# Patient Record
Sex: Female | Born: 1960 | Race: Black or African American | Hispanic: No | Marital: Single | Smoking: Never smoker
Health system: Southern US, Community
[De-identification: ages and names within clinical notes are randomized; demographics above are authoritative.]

## PROBLEM LIST (undated history)

## (undated) DIAGNOSIS — J45909 Unspecified asthma, uncomplicated: Secondary | ICD-10-CM

## (undated) DIAGNOSIS — E119 Type 2 diabetes mellitus without complications: Secondary | ICD-10-CM

## (undated) DIAGNOSIS — I10 Essential (primary) hypertension: Secondary | ICD-10-CM

## (undated) HISTORY — PX: ANKLE ARTHROPLASTY: SUR68

## (undated) HISTORY — PX: KNEE ARTHROSCOPY: SUR90

---

## 2011-04-20 ENCOUNTER — Emergency Department (HOSPITAL_COMMUNITY)
Admission: EM | Admit: 2011-04-20 | Discharge: 2011-04-20 | Disposition: A | Discharge status: Home / Self Care | Payer: Medicare Other | Attending: Emergency Medicine | Admitting: Emergency Medicine

## 2011-04-20 DIAGNOSIS — J45909 Unspecified asthma, uncomplicated: Secondary | ICD-10-CM | POA: Insufficient documentation

## 2011-04-20 DIAGNOSIS — R0682 Tachypnea, not elsewhere classified: Secondary | ICD-10-CM | POA: Insufficient documentation

## 2011-09-10 ENCOUNTER — Emergency Department (HOSPITAL_COMMUNITY): Payer: Medicare Other

## 2011-09-10 ENCOUNTER — Emergency Department (HOSPITAL_COMMUNITY)
Admission: EM | Admit: 2011-09-10 | Discharge: 2011-09-10 | Disposition: A | Discharge status: Home / Self Care | Payer: Medicare Other | Attending: Emergency Medicine | Admitting: Emergency Medicine

## 2011-09-10 DIAGNOSIS — R0602 Shortness of breath: Secondary | ICD-10-CM | POA: Insufficient documentation

## 2011-09-10 DIAGNOSIS — J45909 Unspecified asthma, uncomplicated: Secondary | ICD-10-CM | POA: Insufficient documentation

## 2012-09-03 ENCOUNTER — Emergency Department (HOSPITAL_COMMUNITY): Payer: Medicare Other

## 2012-09-03 ENCOUNTER — Encounter (HOSPITAL_COMMUNITY): Payer: Self-pay | Admitting: Emergency Medicine

## 2012-09-03 ENCOUNTER — Emergency Department (HOSPITAL_COMMUNITY)
Admission: EM | Admit: 2012-09-03 | Discharge: 2012-09-03 | Disposition: A | Discharge status: Home / Self Care | Payer: Medicare Other | Attending: Emergency Medicine | Admitting: Emergency Medicine

## 2012-09-03 DIAGNOSIS — J45909 Unspecified asthma, uncomplicated: Secondary | ICD-10-CM | POA: Insufficient documentation

## 2012-09-03 DIAGNOSIS — J40 Bronchitis, not specified as acute or chronic: Secondary | ICD-10-CM | POA: Insufficient documentation

## 2012-09-03 DIAGNOSIS — J069 Acute upper respiratory infection, unspecified: Secondary | ICD-10-CM | POA: Insufficient documentation

## 2012-09-03 DIAGNOSIS — Z79899 Other long term (current) drug therapy: Secondary | ICD-10-CM | POA: Insufficient documentation

## 2012-09-03 HISTORY — DX: Unspecified asthma, uncomplicated: J45.909

## 2012-09-03 MED ORDER — PREDNISONE 20 MG PO TABS: 60.0000 mg | ORAL_TABLET | Freq: Every day | ORAL | Status: AC

## 2012-09-03 MED ORDER — ALBUTEROL SULFATE (5 MG/ML) 0.5% IN NEBU
5.0000 mg | INHALATION_SOLUTION | Freq: Once | RESPIRATORY_TRACT | Status: AC
  Administered 2012-09-03: 5 mg via RESPIRATORY_TRACT
  Filled 2012-09-03: qty 1

## 2012-09-03 MED ORDER — ALBUTEROL SULFATE (5 MG/ML) 0.5% IN NEBU
INHALATION_SOLUTION | RESPIRATORY_TRACT | Status: AC
  Administered 2012-09-03: 14:00:00
  Filled 2012-09-03: qty 1

## 2012-09-03 MED ORDER — PREDNISONE 20 MG PO TABS
60.0000 mg | ORAL_TABLET | ORAL | Status: AC
  Administered 2012-09-03: 60 mg via ORAL
  Filled 2012-09-03: qty 3

## 2012-09-03 MED ORDER — AZITHROMYCIN 250 MG PO TABS: ORAL_TABLET | ORAL | Status: AC

## 2012-09-03 MED ORDER — ALBUTEROL SULFATE (5 MG/ML) 0.5% IN NEBU
10.0000 mg | INHALATION_SOLUTION | Freq: Once | RESPIRATORY_TRACT | Status: AC
  Administered 2012-09-03: 10 mg via RESPIRATORY_TRACT
  Filled 2012-09-03: qty 1

## 2012-09-03 MED ORDER — AZITHROMYCIN 250 MG PO TABS
500.0000 mg | ORAL_TABLET | Freq: Once | ORAL | Status: AC
  Administered 2012-09-03: 500 mg via ORAL
  Filled 2012-09-03: qty 2

## 2012-09-03 MED ORDER — ALBUTEROL SULFATE HFA 108 (90 BASE) MCG/ACT IN AERS
2.0000 | INHALATION_SPRAY | Freq: Four times a day (QID) | RESPIRATORY_TRACT | Status: DC
  Administered 2012-09-03: 2 via RESPIRATORY_TRACT
  Filled 2012-09-03: qty 6.7

## 2012-09-03 NOTE — ED Notes (Signed)
Patient's sats at 89%, place on 2L [place]-MD aware

## 2012-09-03 NOTE — ED Provider Notes (Signed)
History     CSN: [id number]  Arrival date & time 09/03/12  0909   First MD Initiated Contact with Patient 09/03/12 [phone]      Chief Complaint  Patient presents with  . Shortness of Breath  . URI     HPI  The patient presents with concerns of ongoing cough, congestion.  She states that initially she had congestion in her sinuses, but over the past days she developed chest congestion.  Throughout this time.  She has developed an increasing cough.  The cough is nonproductive.  She notes minimal relief with albuterol nebulizer.  No clear exacerbating factors.  No particular chest pain, or any other pain.  No fever, no chills. No nausea, no vomiting.  Past Medical History  Diagnosis Date  . Asthma     Past Surgical History  Procedure Date  . Knee arthroscopy Bilateral  . Ankle arthroplasty Left    No family history on file.  History  Substance Use Topics  . Smoking status: Never Smoker   . Smokeless tobacco: Not on file  . Alcohol Use: No    OB History    Grav Para Term Preterm Abortions TAB SAB Ect Mult Living                  Review of Systems  Constitutional:       Per HPI, otherwise negative  HENT:       Per HPI, otherwise negative  Eyes: Negative.   Respiratory:       Per HPI, otherwise negative  Cardiovascular:       Per HPI, otherwise negative  Gastrointestinal: Negative for nausea and vomiting.  Genitourinary: Negative.   Musculoskeletal:       Per HPI, otherwise negative  Skin: Negative.   Neurological: Negative for syncope.    Allergies  Penicillins  Home Medications   Current Outpatient Rx  Name Route Sig Dispense Refill  . ALBUTEROL SULFATE HFA 108 (90 BASE) MCG/ACT IN AERS Inhalation Inhale 2 puffs into the lungs every 6 (six) hours as needed. For shortness of breath.    . GUAIFENESIN 100 MG/5ML PO SOLN Oral Take 200 mg by mouth every 4 (four) hours as needed. For cough.    . IBUPROFEN 200 MG PO TABS Oral Take 400 mg by mouth every 8  (eight) hours as needed. For pain.    Marland Kitchen LORATADINE-PSEUDOEPHEDRINE ER 10-240 MG PO TB24 Oral Take 1 tablet by mouth daily as needed. For allergies.      BP 126/110  Pulse 108  Temp 97.7 F (36.5 C)  Resp 19  SpO2 96%  LMP 08/20/2012  Physical Exam  Nursing note and vitals reviewed. Constitutional: She is oriented to person, place, and time. She appears well-developed and well-nourished. No distress.  HENT:  Head: Normocephalic and atraumatic.  Eyes: Conjunctivae normal and EOM are normal.  Cardiovascular: Regular rhythm.  Tachycardia present.   Pulmonary/Chest: No stridor. Tachypnea noted. No respiratory distress. She has wheezes.  Abdominal: She exhibits no distension.  Musculoskeletal: She exhibits no edema.  Neurological: She is alert and oriented to person, place, and time. No cranial nerve deficit.  Skin: Skin is warm and dry.  Psychiatric: She has a normal mood and affect.    ED Course  Procedures (including critical care time)  Labs Reviewed - No data to display No results found.   No diagnosis found.  O2: 88%ra, abnormal > corrects to 99% w [place], abnormal   10:54 AM Patient states  that she feels better.  Wheezing continues.  Neb ordered  1:47 PM Patient feels better.  On exam she sounds better.  The patient was ambulatory without a significant decrease in her saturation. MDM  This pleasant female presents with new cough, congestion, dyspnea.  On exam she improved substantially with multiple albuterol treatments, steroids, antibiotics.  Given this improvement, the normal vital signs after interventions, the patient was discharged with medications for this episode of dyspnea.  Gerhard Munch, MD 09/03/12 [phone]

## 2012-09-03 NOTE — ED Notes (Signed)
[id number]<JY> Expected date:<BR> Expected time: 8:59 AM<BR> Means of arrival:Ambulance<BR> Comments:<BR> 51yoF-cold, wheezing

## 2012-09-03 NOTE — ED Notes (Signed)
Pt 95% at rest, 96% ambulating RA

## 2012-09-03 NOTE — ED Notes (Signed)
Pt here via [facility]  from home. Pt has hx of asthma and c/o a head cold for a week that has gone to her chest. Pt states cough non-productive. Pt denies N/V. Pt has alb neb x1 from EMS. Pt in NAD and A&Ox4.

## 2012-09-03 NOTE — ED Notes (Signed)
Pt will be discharged after respiratory completes inhaler education

## 2012-09-20 ENCOUNTER — Emergency Department (HOSPITAL_COMMUNITY)
Admission: EM | Admit: 2012-09-20 | Discharge: 2012-09-20 | Disposition: A | Discharge status: Home / Self Care | Payer: Medicare Other | Attending: Emergency Medicine | Admitting: Emergency Medicine

## 2012-09-20 ENCOUNTER — Encounter (HOSPITAL_COMMUNITY): Payer: Self-pay | Admitting: Emergency Medicine

## 2012-09-20 ENCOUNTER — Emergency Department (HOSPITAL_COMMUNITY): Payer: Medicare Other

## 2012-09-20 DIAGNOSIS — J45901 Unspecified asthma with (acute) exacerbation: Secondary | ICD-10-CM | POA: Insufficient documentation

## 2012-09-20 DIAGNOSIS — Z79899 Other long term (current) drug therapy: Secondary | ICD-10-CM | POA: Insufficient documentation

## 2012-09-20 DIAGNOSIS — R059 Cough, unspecified: Secondary | ICD-10-CM | POA: Insufficient documentation

## 2012-09-20 DIAGNOSIS — R05 Cough: Secondary | ICD-10-CM | POA: Insufficient documentation

## 2012-09-20 DIAGNOSIS — J3489 Other specified disorders of nose and nasal sinuses: Secondary | ICD-10-CM | POA: Insufficient documentation

## 2012-09-20 DIAGNOSIS — R062 Wheezing: Secondary | ICD-10-CM | POA: Insufficient documentation

## 2012-09-20 DIAGNOSIS — E669 Obesity, unspecified: Secondary | ICD-10-CM | POA: Insufficient documentation

## 2012-09-20 MED ORDER — PREDNISONE 20 MG PO TABS
60.0000 mg | ORAL_TABLET | Freq: Once | ORAL | Status: AC
  Administered 2012-09-20: 60 mg via ORAL
  Filled 2012-09-20: qty 3

## 2012-09-20 MED ORDER — ALBUTEROL SULFATE HFA 108 (90 BASE) MCG/ACT IN AERS
2.0000 | INHALATION_SPRAY | RESPIRATORY_TRACT | Status: DC | PRN
  Administered 2012-09-20: 2 via RESPIRATORY_TRACT
  Filled 2012-09-20 (×2): qty 6.7

## 2012-09-20 MED ORDER — PREDNISONE 20 MG PO TABS: 40.0000 mg | ORAL_TABLET | Freq: Every day | ORAL | Status: DC

## 2012-09-20 MED ORDER — IPRATROPIUM BROMIDE 0.02 % IN SOLN
0.5000 mg | Freq: Once | RESPIRATORY_TRACT | Status: AC
  Administered 2012-09-20: 0.5 mg via RESPIRATORY_TRACT
  Filled 2012-09-20: qty 2.5

## 2012-09-20 MED ORDER — ALBUTEROL SULFATE (5 MG/ML) 0.5% IN NEBU
5.0000 mg | INHALATION_SOLUTION | Freq: Once | RESPIRATORY_TRACT | Status: AC
  Administered 2012-09-20: 5 mg via RESPIRATORY_TRACT
  Filled 2012-09-20: qty 1

## 2012-09-20 NOTE — ED Provider Notes (Addendum)
History     CSN: [id number]  Arrival date & time 09/20/12  1445   First MD Initiated Contact with Patient 09/20/12 1452      Chief Complaint  Patient presents with  . Shortness of Breath    (Consider location/radiation/quality/duration/timing/severity/associated sxs/prior treatment) Patient is a 51 y.o. female presenting with shortness of breath. The history is provided by the patient.  Shortness of Breath  The current episode started 2 days ago. The onset was gradual. The problem occurs frequently. The problem has been gradually worsening. The problem is moderate. The symptoms are relieved by beta-agonist inhalers. The symptoms are aggravated by activity and allergens. Associated symptoms include rhinorrhea, cough, shortness of breath and wheezing. Pertinent negatives include no chest pain and no fever. The cough is productive. She has had intermittent steroid use. She has had no prior ICU admissions. Her past medical history is significant for asthma. There were no sick contacts. Recently, medical care has been given at this facility.    Past Medical History  Diagnosis Date  . Asthma     Past Surgical History  Procedure Date  . Knee arthroscopy Bilateral  . Ankle arthroplasty Left    No family history on file.  History  Substance Use Topics  . Smoking status: Never Smoker   . Smokeless tobacco: Not on file  . Alcohol Use: No    OB History    Grav Para Term Preterm Abortions TAB SAB Ect Mult Living                  Review of Systems  Constitutional: Negative for fever.  HENT: Positive for rhinorrhea.   Respiratory: Positive for cough, shortness of breath and wheezing.   Cardiovascular: Negative for chest pain.  All other systems reviewed and are negative.    Allergies  Penicillins  Home Medications   Current Outpatient Rx  Name  Route  Sig  Dispense  Refill  . ALBUTEROL SULFATE HFA 108 (90 BASE) MCG/ACT IN AERS   Inhalation   Inhale 2 puffs into the  lungs every 6 (six) hours as needed. For shortness of breath.         . IBUPROFEN 200 MG PO TABS   Oral   Take 400 mg by mouth every 8 (eight) hours as needed. For pain.         Marland Kitchen LORATADINE-PSEUDOEPHEDRINE ER 10-240 MG PO TB24   Oral   Take 1 tablet by mouth daily as needed. For allergies.           BP 151/86  Pulse 103  Temp 98 F (36.7 C) (Oral)  Resp 16  SpO2 92%  LMP 08/20/2012  Physical Exam  Nursing note and vitals reviewed. Constitutional: She is oriented to person, place, and time. She appears well-developed and well-nourished. No distress.       Obese  HENT:  Head: Normocephalic and atraumatic.  Mouth/Throat: Oropharynx is clear and moist.  Eyes: Conjunctivae normal and EOM are normal. Pupils are equal, round, and reactive to light.  Neck: Normal range of motion. Neck supple.  Cardiovascular: Normal rate, regular rhythm and intact distal pulses.   No murmur heard. Pulmonary/Chest: Effort normal and breath sounds normal. No respiratory distress. She has no wheezes. She has no rales.  Abdominal: Soft. She exhibits no distension. There is no tenderness. There is no rebound and no guarding.  Musculoskeletal: Normal range of motion. She exhibits no edema and no tenderness.  Neurological: She is alert and oriented to  person, place, and time.  Skin: Skin is warm and dry. Rash noted. No erythema.       Eczema present over the face  Psychiatric: She has a normal mood and affect. Her behavior is normal.    ED Course  Procedures (including critical care time)  Labs Reviewed - No data to display Dg Chest 2 View  09/20/2012  *RADIOLOGY REPORT*  Clinical Data: Shortness of breath and cough  CHEST - 2 VIEW  Comparison: 09/03/2012  Findings: The heart and pulmonary vascularity are within normal limits.  The lungs are free of acute infiltrate or sizable effusion.  Degenerative changes of the thoracic spine are again noted.  IMPRESSION: No acute abnormality seen.    Original Report Authenticated By: Alcide Clever, M.D.      1. Asthma attack       MDM   Pt with typical asthma exacerbation  Symptoms. States she was seen about 2 weeks ago and given prednisone and nebs and was doing well until 2-3 days ago. She was able to manage at home with albuterol but states today was much worse. Patient was given a neb prior to my evaluation and when I evaluated the patient she was no longer wheezing.   productive cough but otherwise no other complaints.  No Wheezing on exam.  will give steroids, CXR pending.  4:49 PM CXR wnl. On re-eval pt has mild wheezing throughout and was given 1 additional neb and d/ced home.       Gwyneth Sprout, MD 09/20/12 1650  Gwyneth Sprout, MD 09/20/12 1654

## 2012-09-20 NOTE — ED Notes (Signed)
Per EMS: SOB flaring up the past couple of days, pt states inhaler is not working

## 2012-09-20 NOTE — ED Notes (Signed)
[id number]<AV> Expected date:<BR> Expected time:<BR> Means of arrival:<BR> Comments:<BR> Bronchitis

## 2013-01-09 ENCOUNTER — Emergency Department (HOSPITAL_COMMUNITY)
Admission: EM | Admit: 2013-01-09 | Discharge: 2013-01-09 | Disposition: A | Discharge status: Home / Self Care | Payer: Medicare Other | Attending: Emergency Medicine | Admitting: Emergency Medicine

## 2013-01-09 ENCOUNTER — Encounter (HOSPITAL_COMMUNITY): Payer: Self-pay | Admitting: Emergency Medicine

## 2013-01-09 ENCOUNTER — Emergency Department (HOSPITAL_COMMUNITY): Payer: Medicare Other

## 2013-01-09 DIAGNOSIS — J45901 Unspecified asthma with (acute) exacerbation: Secondary | ICD-10-CM | POA: Insufficient documentation

## 2013-01-09 DIAGNOSIS — Z79899 Other long term (current) drug therapy: Secondary | ICD-10-CM | POA: Insufficient documentation

## 2013-01-09 DIAGNOSIS — J3489 Other specified disorders of nose and nasal sinuses: Secondary | ICD-10-CM | POA: Insufficient documentation

## 2013-01-09 DIAGNOSIS — R0789 Other chest pain: Secondary | ICD-10-CM | POA: Insufficient documentation

## 2013-01-09 MED ORDER — ALBUTEROL SULFATE (5 MG/ML) 0.5% IN NEBU
2.5000 mg | INHALATION_SOLUTION | Freq: Once | RESPIRATORY_TRACT | Status: AC
  Administered 2013-01-09: 2.5 mg via RESPIRATORY_TRACT
  Filled 2013-01-09: qty 0.5

## 2013-01-09 MED ORDER — ALBUTEROL SULFATE HFA 108 (90 BASE) MCG/ACT IN AERS
2.0000 | INHALATION_SPRAY | RESPIRATORY_TRACT | Status: DC | PRN
  Administered 2013-01-09: 2 via RESPIRATORY_TRACT
  Filled 2013-01-09: qty 6.7

## 2013-01-09 MED ORDER — ALBUTEROL SULFATE (5 MG/ML) 0.5% IN NEBU
5.0000 mg | INHALATION_SOLUTION | Freq: Once | RESPIRATORY_TRACT | Status: AC
  Administered 2013-01-09: 5 mg via RESPIRATORY_TRACT
  Filled 2013-01-09: qty 1

## 2013-01-09 MED ORDER — IPRATROPIUM BROMIDE 0.02 % IN SOLN
0.5000 mg | Freq: Once | RESPIRATORY_TRACT | Status: AC
  Administered 2013-01-09: 0.5 mg via RESPIRATORY_TRACT
  Filled 2013-01-09: qty 2.5

## 2013-01-09 NOTE — ED Notes (Signed)
MD at bedside. 

## 2013-01-09 NOTE — ED Provider Notes (Signed)
History     CSN: [id number]  Arrival date & time 01/09/13  0131   First MD Initiated Contact with Patient 01/09/13 0139      Chief Complaint  Patient presents with  . Shortness of Breath    (Consider location/radiation/quality/duration/timing/severity/associated sxs/prior treatment) HPI  Patient has history of reactive airway disease. She relates she started running out of her inhaler about 3 weeks ago and her PCP would not prescribe another one without an office visit. She states when she ran out she used a friend's inhaler that was almost empty and it ran out also on the morning of every 28th. She reports since then she's been having wheezing off and on. She has a dry cough without fever. She has chronic rhinorrhea which is not changed. She denies sore throat, vomiting or diarrhea. She states she does feel like her chest is tight. She states that her asthma gets worse depending on the weather.  PCP Dr. Mirna Mires, she has an appointment to see him tomorrow  Past Medical History  Diagnosis Date  . Asthma      Past Surgical History  Procedure Laterality Date  . Knee arthroscopy  Bilateral  . Ankle arthroplasty  Left    No family history on file.  History  Substance Use Topics  . Smoking status: Never Smoker   . Smokeless tobacco: Not on file  . Alcohol Use: No   on disability for bilateral quads injuries in the past  OB History   Grav Para Term Preterm Abortions TAB SAB Ect Mult Living                  Review of Systems  All other systems reviewed and are negative.    Allergies  Penicillins  Home Medications   Current Outpatient Rx  Name  Route  Sig  Dispense  Refill  . albuterol (PROVENTIL HFA;VENTOLIN HFA) 108 (90 BASE) MCG/ACT inhaler   Inhalation   Inhale 2 puffs into the lungs every 6 (six) hours as needed. For shortness of breath.         Marland Kitchen ibuprofen (ADVIL,MOTRIN) 200 MG tablet   Oral   Take 400 mg by mouth every 8 (eight) hours as needed.  For pain.         Marland Kitchen loratadine-pseudoephedrine (CLARITIN-D 24-HOUR) 10-240 MG per 24 hr tablet   Oral   Take 1 tablet by mouth daily as needed. For allergies.           BP 130/66  Pulse 94  Temp(Src) 97.4 F (36.3 C) (Oral)  Resp 18  SpO2 94%  LMP 12/26/2012  Vital signs normal    Physical Exam  Nursing note and vitals reviewed. Constitutional: She is oriented to person, place, and time. She appears well-developed and well-nourished.  Non-toxic appearance. She does not appear ill. No distress.  Examined after first nebulizer tx  HENT:  Head: Normocephalic and atraumatic.  Right Ear: External ear normal.  Left Ear: External ear normal.  Nose: Nose normal. No mucosal edema or rhinorrhea.  Mouth/Throat: Oropharynx is clear and moist and mucous membranes are normal. No dental abscesses or edematous.  Eyes: Conjunctivae and EOM are normal. Pupils are equal, round, and reactive to light.  Neck: Normal range of motion and full passive range of motion without pain. Neck supple.  Cardiovascular: Normal rate, regular rhythm and normal heart sounds.  Exam reveals no gallop and no friction rub.   No murmur heard. Pulmonary/Chest: Effort normal. No respiratory distress.  She has wheezes. She has no rhonchi. She has no rales. She exhibits no tenderness and no crepitus.  Faint end expiratory  Abdominal: Soft. Normal appearance and bowel sounds are normal. She exhibits no distension. There is no tenderness. There is no rebound and no guarding.  Musculoskeletal: Normal range of motion. She exhibits no edema and no tenderness.  Moves all extremities well.   Neurological: She is alert and oriented to person, place, and time. She has normal strength. No cranial nerve deficit.  Skin: Skin is warm, dry and intact. No rash noted. No erythema. No pallor.  Eczema on forehead  Psychiatric: She has a normal mood and affect. Her speech is normal and behavior is normal. Her mood appears not anxious.     ED Course  Procedures (including critical care time)  Medications    albuterol (PROVENTIL) (5 MG/ML) 0.5% nebulizer solution 5 mg (5 mg Nebulization Given 01/09/13 0159)  albuterol (PROVENTIL) (5 MG/ML) 0.5% nebulizer solution 2.5 mg (2.5 mg Nebulization Given 01/09/13 0421)  ipratropium (ATROVENT) nebulizer solution 0.5 mg (0.5 mg Nebulization Given 01/09/13 0421)   Pt examined after her first nebulizer, has rare end expir wheeze, will repeat and patient should be able to be discharged.    Dg Chest 2 View  01/09/2013  *RADIOLOGY REPORT*  Clinical Data: 52 year old female with chest tightness shortness of breath and wheezing.  CHEST - 2 VIEW  Comparison: 09/20/2012 and earlier.  Findings: Stable lung volumes.  Stable cardiac size at the upper limits of normal. Other mediastinal contours are within normal limits.  Visualized tracheal air column is within normal limits. No pneumothorax, pulmonary edema, pleural effusion or acute pulmonary opacity. No acute osseous abnormality identified.  IMPRESSION: No acute cardiopulmonary abnormality.   Original Report Authenticated By: Erskine Speed, M.D.      1. Asthma exacerbation     Discharge medications albuterol (PROVENTIL HFA;VENTOLIN HFA) 108 (90 BASE) MCG/ACT inhaler 2 puff (not administered)    Plan discharge  Devoria Albe, MD, FACEP   MDM          Ward Givens, MD 01/09/13 [phone]

## 2013-01-09 NOTE — ED Notes (Signed)
Pt c/o SOB x few days. Hx of asthma. Pt has not had inhaler x 3 weeks, pt using friend's inhaler. Last inhaler use was Friday night. Lungs clear. VS 132 palpated, HR 100, RR 24, Pulse 99% RA at 0108. AAOx4. Ambulates with walker. Pt nervous since all neighbors moved and wanted to seek help incase something happened.

## 2013-01-09 NOTE — ED Notes (Signed)
[id number]<AV> Expected date:<BR> Expected time:<BR> Means of arrival:<BR> Comments:<BR> EMS/SOB-hx asthma-out of inhalers

## 2013-01-09 NOTE — ED Notes (Signed)
Patient transported to X-ray 

## 2015-06-24 ENCOUNTER — Emergency Department (HOSPITAL_COMMUNITY)
Admission: EM | Admit: 2015-06-24 | Discharge: 2015-06-25 | Disposition: A | Discharge status: Home / Self Care | Payer: Medicare Other | Attending: Emergency Medicine | Admitting: Emergency Medicine

## 2015-06-24 ENCOUNTER — Emergency Department (HOSPITAL_COMMUNITY): Payer: Medicare Other

## 2015-06-24 ENCOUNTER — Encounter (HOSPITAL_COMMUNITY): Payer: Self-pay | Admitting: Emergency Medicine

## 2015-06-24 DIAGNOSIS — J45909 Unspecified asthma, uncomplicated: Secondary | ICD-10-CM | POA: Insufficient documentation

## 2015-06-24 DIAGNOSIS — R42 Dizziness and giddiness: Secondary | ICD-10-CM

## 2015-06-24 DIAGNOSIS — R002 Palpitations: Secondary | ICD-10-CM | POA: Diagnosis not present

## 2015-06-24 DIAGNOSIS — Z79899 Other long term (current) drug therapy: Secondary | ICD-10-CM | POA: Diagnosis not present

## 2015-06-24 DIAGNOSIS — E119 Type 2 diabetes mellitus without complications: Secondary | ICD-10-CM | POA: Diagnosis not present

## 2015-06-24 DIAGNOSIS — Z880 Allergy status to penicillin: Secondary | ICD-10-CM | POA: Diagnosis not present

## 2015-06-24 HISTORY — DX: Type 2 diabetes mellitus without complications: E11.9

## 2015-06-24 LAB — I-STAT TROPONIN, ED: TROPONIN I, POC: 0 ng/mL (ref 0.00–0.08)

## 2015-06-24 LAB — BASIC METABOLIC PANEL
ANION GAP: 11 (ref 5–15)
BUN: 13 mg/dL (ref 6–20)
CO2: 25 mmol/L (ref 22–32)
Calcium: 8.7 mg/dL — ABNORMAL LOW (ref 8.9–10.3)
Chloride: 102 mmol/L (ref 101–111)
Creatinine, Ser: 0.77 mg/dL (ref 0.44–1.00)
GFR calc non Af Amer: >60 mL/min (ref >60)
GLUCOSE: 255 mg/dL — AB (ref 65–99)
POTASSIUM: 4.1 mmol/L (ref 3.5–5.1)
SODIUM: 138 mmol/L (ref 135–145)

## 2015-06-24 LAB — CBC
HEMATOCRIT: 48.4 % — AB (ref 36.0–46.0)
Hemoglobin: 15.7 g/dL — ABNORMAL HIGH (ref 12.0–15.0)
MCH: 28.6 pg (ref 26.0–34.0)
MCHC: 32.4 g/dL (ref 30.0–36.0)
MCV: 88.2 fL (ref 78.0–100.0)
PLATELETS: 346 10*3/uL (ref 150–400)
RBC: 5.49 MIL/uL — ABNORMAL HIGH (ref 3.87–5.11)
RDW: 15.5 % (ref 11.5–15.5)
WBC: 9 10*3/uL (ref 4.0–10.5)

## 2015-06-24 MED ORDER — SODIUM CHLORIDE 0.9 % IV BOLUS (SEPSIS)
2000.0000 mL | Freq: Once | INTRAVENOUS | Status: AC
  Administered 2015-06-24: 2000 mL via INTRAVENOUS

## 2015-06-24 NOTE — ED Provider Notes (Signed)
CSN: [id number]     Arrival date & time 06/24/15  2113 History   First MD Initiated Contact with Patient 06/24/15 2232     Chief Complaint  Patient presents with  . Dizziness  . Palpitations     (Consider location/radiation/quality/duration/timing/severity/associated sxs/prior Treatment) HPI Ann Holt is a 54 y.o. female with history of asthma and diabetes comes in for evaluation of dizziness. Patient states she was taking a nap, woke up at 7:00, got up out of bed at which point she started to feel slightly dizzy. She reports sitting back down and the symptoms resolving. She reports trying to get up out of bed again and then began to feel slightly dizzy again. She reports walking to the kitchen, bent over to pick up a dish and when she stood back up" the room started spinning and I could feel my heart beating". At no point did she have any headache, chest pain, shortness of breath, numbness or weakness, nausea or vomiting. She reports feeling much better since arrival in the ED. She denies any discomfort now. Nonsmoker.  Past Medical History  Diagnosis Date  . Asthma   . Diabetes mellitus without complication    Past Surgical History  Procedure Laterality Date  . Knee arthroscopy  Bilateral  . Ankle arthroplasty  Left   No family history on file. Social History  Substance Use Topics  . Smoking status: Never Smoker   . Smokeless tobacco: None  . Alcohol Use: No   OB History    No data available     Review of Systems A 10 point review of systems was completed and was negative except for pertinent positives and negatives as mentioned in the history of present illness     Allergies  Penicillins  Home Medications   Prior to Admission medications   Medication Sig Start Date End Date Taking? Authorizing Provider  albuterol (PROVENTIL HFA;VENTOLIN HFA) 108 (90 BASE) MCG/ACT inhaler Inhale 2 puffs into the lungs every 6 (six) hours as needed. For shortness of breath.   Yes  Historical Provider, MD  glipiZIDE (GLUCOTROL XL) 10 MG 24 hr tablet Take 10 mg by mouth daily with breakfast.  07/28/14 07/28/15 Yes Historical Provider, MD  ibuprofen (ADVIL,MOTRIN) 200 MG tablet Take 400 mg by mouth every 8 (eight) hours as needed for moderate pain. For pain.   Yes Historical Provider, MD  lisinopril (PRINIVIL,ZESTRIL) 10 MG tablet Take 10 mg by mouth daily.  08/25/14  Yes Historical Provider, MD  loratadine-pseudoephedrine (CLARITIN-D 24-HOUR) 10-240 MG per 24 hr tablet Take 1 tablet by mouth daily as needed for allergies. For allergies.   Yes Historical Provider, MD  metFORMIN (GLUCOPHAGE) 1000 MG tablet Take 1,000 mg by mouth 2 (two) times daily with a meal.  08/25/14 08/25/15 Yes Historical Provider, MD  montelukast (SINGULAIR) 10 MG tablet Take 10 mg by mouth at bedtime.   Yes Historical Provider, MD   BP 144/94 mmHg  Pulse 87  Temp(Src) 97.7 F (36.5 C) (Oral)  Resp 16  SpO2 95%  LMP 12/26/2012 Physical Exam  Constitutional: She is oriented to person, place, and time. She appears well-developed and well-nourished.  Morbidly obese  HENT:  Head: Normocephalic and atraumatic.  Mouth/Throat: Oropharynx is clear and moist.  Eyes: Conjunctivae are normal. Pupils are equal, round, and reactive to light. Right eye exhibits no discharge. Left eye exhibits no discharge. No scleral icterus.  Neck: Neck supple.  Cardiovascular: Normal rate, regular rhythm and normal heart sounds.   Pulmonary/Chest:  Effort normal and breath sounds normal. No respiratory distress. She has no wheezes. She has no rales.  Abdominal: Soft. There is no tenderness.  Musculoskeletal: She exhibits no tenderness.  Neurological: She is alert and oriented to person, place, and time.  Cranial Nerves II-XII grossly intact. Moves all extremities without ataxia. At baseline per family the room.  Skin: Skin is warm and dry. No rash noted.  Psychiatric: She has a normal mood and affect.  Nursing note and  vitals reviewed.   ED Course  Procedures (including critical care time) Labs Review Labs Reviewed  BASIC METABOLIC PANEL - Abnormal; Notable for the following:    Glucose, Bld 255 (*)    Calcium 8.7 (*)    All other components within normal limits  CBC - Abnormal; Notable for the following:    RBC 5.49 (*)    Hemoglobin 15.7 (*)    HCT 48.4 (*)    All other components within normal limits  Rosezena Sensor, ED    Imaging Review Dg Chest 2 View  06/24/2015   CLINICAL DATA:  Awoke with dizziness and diaphoresis. Chest palpitations. Baseline shortness of breath, history of asthma and diabetes.  EXAM: CHEST  2 VIEW  COMPARISON:  Chest radiograph January 09, 2013  FINDINGS: The heart size and mediastinal contours are within normal limits. Both lungs are clear. The visualized skeletal structures are nonacute; moderate degenerative change of the thoracic spine.  IMPRESSION: No acute cardiopulmonary process.   Electronically Signed   By: Awilda Metro M.D.   On: 06/24/2015 22:09   I, Sandee Bernath, Earle Gell, personally reviewed and evaluated these images and lab results as part of my medical decision-making.   EKG Interpretation   Date/Time:  Sunday June 24 2015 21:26:36 EDT Ventricular Rate:  106 PR Interval:  136 QRS Duration: 80 QT Interval:  347 QTC Calculation: 461 R Axis:   61 Text Interpretation:  Sinus tachycardia Confirmed by Adriana Simas  MD, BRIAN  [phone]) on 06/24/2015 11:10:33 PM Also confirmed by Adriana Simas  MD, BRIAN ([id number])   on 06/24/2015 11:10:52 PM     Meds given in ED:  Medications  sodium chloride 0.9 % bolus 2,000 mL (0 mLs Intravenous Stopped 06/25/15 0031)    New Prescriptions   No medications on file   Filed Vitals:   06/24/15 2319 06/25/15 0028 06/25/15 0030 06/25/15 0031  BP: 112/51 144/94  144/94  Pulse: 89 89 87   Temp:  97.7 F (36.5 C)    TempSrc:  Oral    Resp: 18 16    SpO2: 95% 95% 95%     MDM  Vitals stable - WNL -afebrile Pt resting comfortably  in ED. PE--normal heart and lung exam. Physical exam is not concerning Labwork-labs are noncontributory. Imaging-chest x-ray shows no acute cardiopulmonary pathology.  DDX--patient tachycardia resolved with 2 L of normal saline IV fluids. Suspect symptoms especially yesterday due to orthostasis versus dehydration. Low suspicion for any acute cardiopulmonary pathology or central lesion. Patient denies any discomfort whatsoever now in ED. Daughter reports patient is at baseline. Both are comfortable with discharge.  I discussed all relevant lab findings and imaging results with pt and they verbalized understanding. Discussed f/u with PCP within 48 hrs and return precautions, pt very amenable to plan. Prior to patient discharge, I discussed and reviewed this case with Dr. Adriana Simas  Final diagnoses:  Dizziness        Joycie Peek, PA-C 06/25/15 0981  Donnetta Hutching, MD 06/27/15 [phone]

## 2015-06-24 NOTE — ED Notes (Signed)
Pt reports she woke up from a nap with dizziness and states "the room is spinning" and "my heart is letting itself be known". Pt denies cardiac HX.  Denies CP but reports her usual shortness of breath. Denies changes in vision,headaches, or weakness.

## 2015-06-25 NOTE — Discharge Instructions (Signed)
You were evaluated in the ED today for dizziness. There does not appear to be an emergent cause for your symptoms at this time. Your exam, labs, EKG and chest x-ray are all reassuring. Please follow-up with your doctors as needed for further evaluation and management of her symptoms. Return to ED for new or worsening symptoms.  Dizziness Dizziness is a common problem. It is a feeling of unsteadiness or light-headedness. You may feel like you are about to faint. Dizziness can lead to injury if you stumble or fall. A person of any age group can suffer from dizziness, but dizziness is more common in older adults. CAUSES  Dizziness can be caused by many different things, including:  Middle ear problems.  Standing for too long.  Infections.  An allergic reaction.  Aging.  An emotional response to something, such as the sight of blood.  Side effects of medicines.  Tiredness.  Problems with circulation or blood pressure.  Excessive use of alcohol or medicines, or illegal drug use.  Breathing too fast (hyperventilation).  An irregular heart rhythm (arrhythmia).  A low red blood cell count (anemia).  Pregnancy.  Vomiting, diarrhea, fever, or other illnesses that cause body fluid loss (dehydration).  Diseases or conditions such as Parkinson's disease, high blood pressure (hypertension), diabetes, and thyroid problems.  Exposure to extreme heat. DIAGNOSIS  Your health care provider will ask about your symptoms, perform a physical exam, and perform an electrocardiogram (ECG) to record the electrical activity of your heart. Your health care provider may also perform other heart or blood tests to determine the cause of your dizziness. These may include:  Transthoracic echocardiogram (TTE). During echocardiography, sound waves are used to evaluate how blood flows through your heart.  Transesophageal echocardiogram (TEE).  Cardiac monitoring. This allows your health care provider to  monitor your heart rate and rhythm in real time.  Holter monitor. This is a portable device that records your heartbeat and can help diagnose heart arrhythmias. It allows your health care provider to track your heart activity for several days if needed.  Stress tests by exercise or by giving medicine that makes the heart beat faster. TREATMENT  Treatment of dizziness depends on the cause of your symptoms and can vary greatly. HOME CARE INSTRUCTIONS   Drink enough fluids to keep your urine clear or pale yellow. This is especially important in very hot weather. In older adults, it is also important in cold weather.  Take your medicine exactly as directed if your dizziness is caused by medicines. When taking blood pressure medicines, it is especially important to get up slowly.  Rise slowly from chairs and steady yourself until you feel okay.  In the morning, first sit up on the side of the bed. When you feel okay, stand slowly while holding onto something until you know your balance is fine.  Move your legs often if you need to stand in one place for a long time. Tighten and relax your muscles in your legs while standing.  Have someone stay with you for 1-2 days if dizziness continues to be a problem. Do this until you feel you are well enough to stay alone. Have the person call your health care provider if he or she notices changes in you that are concerning.  Do not drive or use heavy machinery if you feel dizzy.  Do not drink alcohol. SEEK IMMEDIATE MEDICAL CARE IF:   Your dizziness or light-headedness gets worse.  You feel nauseous or vomit.  You have problems talking, walking, or using your arms, hands, or legs.  You feel weak.  You are not thinking clearly or you have trouble forming sentences. It may take a friend or family member to notice this.  You have chest pain, abdominal pain, shortness of breath, or sweating.  Your vision changes.  You notice any bleeding.  You  have side effects from medicine that seems to be getting worse rather than better. MAKE SURE YOU:   Understand these instructions.  Will watch your condition.  Will get help right away if you are not doing well or get worse. Document Released: 04/22/2001 Document Revised: 11/01/2013 Document Reviewed: 05/16/2011 [employer] [facility] [employer] Patient Information 2015 [place], [place]. This information is not intended to replace advice given to you by your health care provider. Make sure you discuss any questions you have with your health care provider.

## 2016-11-24 ENCOUNTER — Emergency Department (HOSPITAL_COMMUNITY)
Admission: EM | Admit: 2016-11-24 | Discharge: 2016-11-24 | Disposition: A | Discharge status: Home / Self Care | Payer: Medicare Other | Attending: Emergency Medicine | Admitting: Emergency Medicine

## 2016-11-24 ENCOUNTER — Encounter (HOSPITAL_COMMUNITY): Payer: Self-pay | Admitting: Emergency Medicine

## 2016-11-24 ENCOUNTER — Emergency Department (HOSPITAL_COMMUNITY): Payer: Medicare Other

## 2016-11-24 DIAGNOSIS — J45909 Unspecified asthma, uncomplicated: Secondary | ICD-10-CM | POA: Diagnosis not present

## 2016-11-24 DIAGNOSIS — E119 Type 2 diabetes mellitus without complications: Secondary | ICD-10-CM | POA: Insufficient documentation

## 2016-11-24 DIAGNOSIS — M1712 Unilateral primary osteoarthritis, left knee: Secondary | ICD-10-CM | POA: Diagnosis not present

## 2016-11-24 DIAGNOSIS — M25562 Pain in left knee: Secondary | ICD-10-CM | POA: Diagnosis present

## 2016-11-24 DIAGNOSIS — Z7984 Long term (current) use of oral hypoglycemic drugs: Secondary | ICD-10-CM | POA: Diagnosis not present

## 2016-11-24 MED ORDER — HYDROCODONE-ACETAMINOPHEN 5-325 MG PO TABS: ORAL_TABLET | ORAL | 0 refills | Status: DC

## 2016-11-24 MED ORDER — ALBUTEROL SULFATE HFA 108 (90 BASE) MCG/ACT IN AERS
2.0000 | INHALATION_SPRAY | Freq: Once | RESPIRATORY_TRACT | Status: AC
  Administered 2016-11-24: 2 via RESPIRATORY_TRACT
  Filled 2016-11-24: qty 6.7

## 2016-11-24 MED ORDER — HYDROCODONE-ACETAMINOPHEN 5-325 MG PO TABS
1.0000 | ORAL_TABLET | Freq: Once | ORAL | Status: AC
  Administered 2016-11-24: 1 via ORAL
  Filled 2016-11-24: qty 1

## 2016-11-24 NOTE — Discharge Instructions (Signed)
Take vicodin for breakthrough pain, do not drink alcohol, drive, care for children or do other critical tasks while taking vicodin. ° °Please be very careful not to fall! °The pain medication puts you at risk for falls. Please rest as much as possible and try to not stay alone.  ° °Please follow with your primary care doctor in the next 2 days for a check-up. They must obtain records for further management.  ° °Do not hesitate to return to the Emergency Department for any new, worsening or concerning symptoms.  ° ° °

## 2016-11-24 NOTE — ED Notes (Signed)
[facility] Expected date:  Expected time:  Means of arrival:  Comments: EMS knee pain after previous quad tear

## 2016-11-24 NOTE — ED Notes (Signed)
Pain med given tech to walk pt in 15 mins

## 2016-11-24 NOTE — ED Triage Notes (Signed)
Pt comes to ed via ems,c/o left knee pain ( associated from a old fall 20 yrs ago).  Pt says ibuprofen is not working and cold weather right now is making her stiff and having mobility issues. V/s on arrival 156/88, pulse 100, cbg244,  Hx of diabetes/ has not taken her medicine verbalized by ems. Medical hx of asthma. Pt comes from home [address] [place]. Alert x4

## 2016-11-24 NOTE — ED Provider Notes (Signed)
[facility]-EMERGENCY DEPT Provider Note   CSN: [id number] Arrival date & time: 11/24/16  1937  By signing my name below, I, Modena Jansky, attest that this documentation has been prepared under the direction and in the presence of non-physician practitioner, Wynetta Emery, PA. Electronically Signed: Modena Jansky, Scribe. 11/24/2016. 9:11 PM.  History   Chief Complaint Chief Complaint  Patient presents with  . Knee Pain    left knee    The history is provided by the patient. No language interpreter was used.   HPI Comments: Ann Holt is a 56 y.o. female with a PMHx of asthma and DM who presents to the Emergency Department complaining of constant moderate left knee pain that started a few days ago. She states her same pain was unresolved from a fall 20 years ago and has recently worsened with the cold weather. She has been taking 800mg  ibuprofen without any relief since pain worsened. Before worsening, pain was managed prior with ibuprofen. She describes the pain as exacerbated by movement. No associated symptoms. She has been using a cane for ambulation recently. She admits to a left quadricept surgery and plans on having a left knee replacement surgery in the future. She also wants an albuterol inhaler. She denies any other complaints.     PCP: Evlyn Courier, MD  Past Medical History:  Diagnosis Date  . Asthma   . Diabetes mellitus without complication (HCC)     There are no active problems to display for this patient.   Past Surgical History:  Procedure Laterality Date  . ANKLE ARTHROPLASTY  Left  . KNEE ARTHROSCOPY  Bilateral    OB History    No data available       Home Medications    Prior to Admission medications   Medication Sig Start Date End Date Taking? Authorizing Provider  albuterol (PROVENTIL HFA;VENTOLIN HFA) 108 (90 BASE) MCG/ACT inhaler Inhale 2 puffs into the lungs every 6 (six) hours as needed. For shortness of breath.    Historical Provider, MD    glipiZIDE (GLUCOTROL XL) 10 MG 24 hr tablet Take 10 mg by mouth daily with breakfast.  07/28/14 07/28/15  Historical Provider, MD  HYDROcodone-acetaminophen (NORCO/VICODIN) 5-325 MG tablet Take 1-2 tablets by mouth every 6 hours as needed for pain. 11/24/16   Kumar Falwell, PA-C  ibuprofen (ADVIL,MOTRIN) 200 MG tablet Take 400 mg by mouth every 8 (eight) hours as needed for moderate pain. For pain.    Historical Provider, MD  lisinopril (PRINIVIL,ZESTRIL) 10 MG tablet Take 10 mg by mouth daily.  08/25/14   Historical Provider, MD  loratadine-pseudoephedrine (CLARITIN-D 24-HOUR) 10-240 MG per 24 hr tablet Take 1 tablet by mouth daily as needed for allergies. For allergies.    Historical Provider, MD  metFORMIN (GLUCOPHAGE) 1000 MG tablet Take 1,000 mg by mouth 2 (two) times daily with a meal.  08/25/14 08/25/15  Historical Provider, MD  montelukast (SINGULAIR) 10 MG tablet Take 10 mg by mouth at bedtime.    Historical Provider, MD    Family History No family history on file.  Social History Social History  Substance Use Topics  . Smoking status: Never Smoker  . Smokeless tobacco: Not on file  . Alcohol use No     Allergies   Penicillins   Review of Systems Review of Systems A complete 10 system review of systems was obtained and all systems are negative except as noted in the HPI and PMH.   Physical Exam Updated Vital Signs BP 150/58 (  BP Location: Left Arm)   Pulse 91   Temp 97.6 F (36.4 C) (Oral)   Resp 18   LMP 12/26/2012   SpO2 94%   Physical Exam  Constitutional: She is oriented to person, place, and time. She appears well-developed and well-nourished. No distress.  Obese  HENT:  Head: Normocephalic and atraumatic.  Mouth/Throat: Oropharynx is clear and moist.  Eyes: Conjunctivae and EOM are normal. Pupils are equal, round, and reactive to light.  Neck: Normal range of motion.  Cardiovascular: Normal rate, regular rhythm and intact distal pulses.    Pulmonary/Chest: Effort normal and breath sounds normal.  Abdominal: Soft. There is no tenderness.  Musculoskeletal: Normal range of motion. She exhibits edema.  Left knee with remote well-healed surgical scar excellent range of motion, trace warmth and mild effusion.  Neurological: She is alert and oriented to person, place, and time.  Skin: She is not diaphoretic.  Psychiatric: She has a normal mood and affect.  Nursing note and vitals reviewed.    ED Treatments / Results  DIAGNOSTIC STUDIES: Oxygen Saturation is 94% on RA, normal by my interpretation.    COORDINATION OF CARE: 9:15 PM- Pt advised of plan for treatment and pt agrees.  Labs (all labs ordered are listed, but only abnormal results are displayed) Labs Reviewed - No data to display  EKG  EKG Interpretation None       Radiology Dg Knee Complete 4 Views Left  Result Date: 11/24/2016 CLINICAL DATA:  Left knee pain for 1 week, worsened today. Intermittent pain for many years. EXAM: LEFT KNEE - COMPLETE 4+ VIEW COMPARISON:  None. FINDINGS: No acute fracture or subluxation. Tricompartmental osteoarthritis with peripheral spurring. Mild medial tibiofemoral joint space narrowing. No evidence of focal lesion or bony destructive change. Quadriceps tendon enthesophyte. There is a moderate joint effusion. IMPRESSION: 1. Mild to moderate tricompartmental osteoarthritis. No acute bony abnormality. 2. Moderate joint effusion. Electronically Signed   By: Rubye Oaks M.D.   On: 11/24/2016 21:21    Procedures Procedures (including critical care time)  Medications Ordered in ED Medications  HYDROcodone-acetaminophen (NORCO/VICODIN) 5-325 MG per tablet 1 tablet (not administered)     Initial Impression / Assessment and Plan / ED Course  I have reviewed the triage vital signs and the nursing notes.  Pertinent labs & imaging results that were available during my care of the patient were reviewed by me and considered in my  medical decision making (see chart for details).  Clinical Course     Vitals:   11/24/16 1951  BP: 150/58  Pulse: 91  Resp: 18  Temp: 97.6 F (36.4 C)  TempSrc: Oral  SpO2: 94%    Medications  HYDROcodone-acetaminophen (NORCO/VICODIN) 5-325 MG per tablet 1 tablet (not administered)    Ann Holt is 56 y.o. female presenting with Dramatic exacerbation of her chronic left knee pain. She follows with Dr. Constance Goltz and a knee replacement is eventually planned. Excellent range of motion, doubt septic arthritis. Trace warmth consistent with inflammatory reaction. She didn't taking ibuprofen at home with little relief, will give a short prescription of Vicodin. Counseled her on fall precautions. X-ray with tricompartmental osteoarthritis with moderate effusion.  Evaluation does not show pathology that would require ongoing emergent intervention or inpatient treatment. Pt is hemodynamically stable and mentating appropriately. Discussed findings and plan with patient/guardian, who agrees with care plan. All questions answered. Return precautions discussed and outpatient follow up given.      Final Clinical Impressions(s) / ED Diagnoses  Final diagnoses:  Arthritis of left knee    New Prescriptions New Prescriptions   HYDROCODONE-ACETAMINOPHEN (NORCO/VICODIN) 5-325 MG TABLET    Take 1-2 tablets by mouth every 6 hours as needed for pain.   I personally performed the services described in this documentation, which was scribed in my presence. The recorded information has been reviewed and is accurate.     Wynetta Emeryicole Clovis Warwick, PA-C 11/24/16 2135    Arby BarretteMarcy Pfeiffer, MD 11/25/16 [phone]

## 2017-07-10 IMAGING — CR DG KNEE COMPLETE 4+V*L*
4 series · 4 of 4 positions shown · non-contrast
Comparison: None.

CLINICAL DATA: Left knee pain for 1 week, worsened today.
Intermittent pain for many years.

EXAM:
LEFT KNEE - COMPLETE 4+ VIEW

[x knee ap left (1 of 4)]
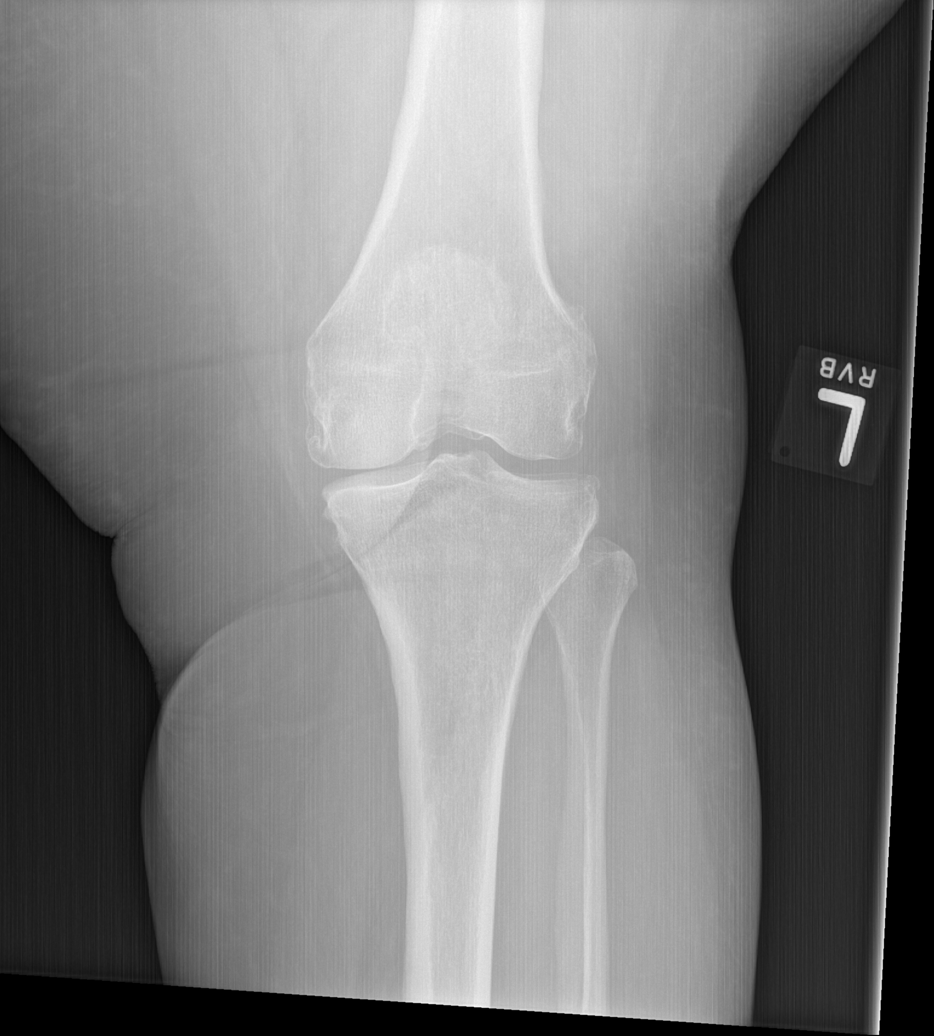

[x knee ap left (2 of 4)]
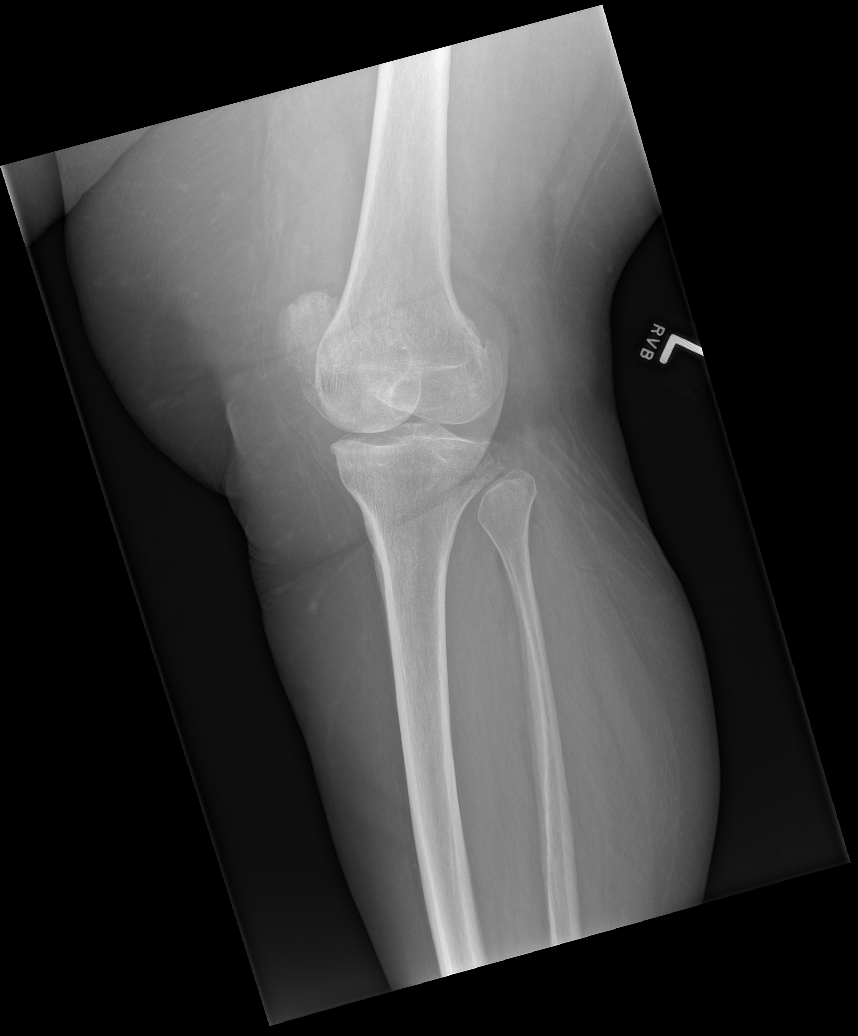

[x knee ap left (3 of 4)]
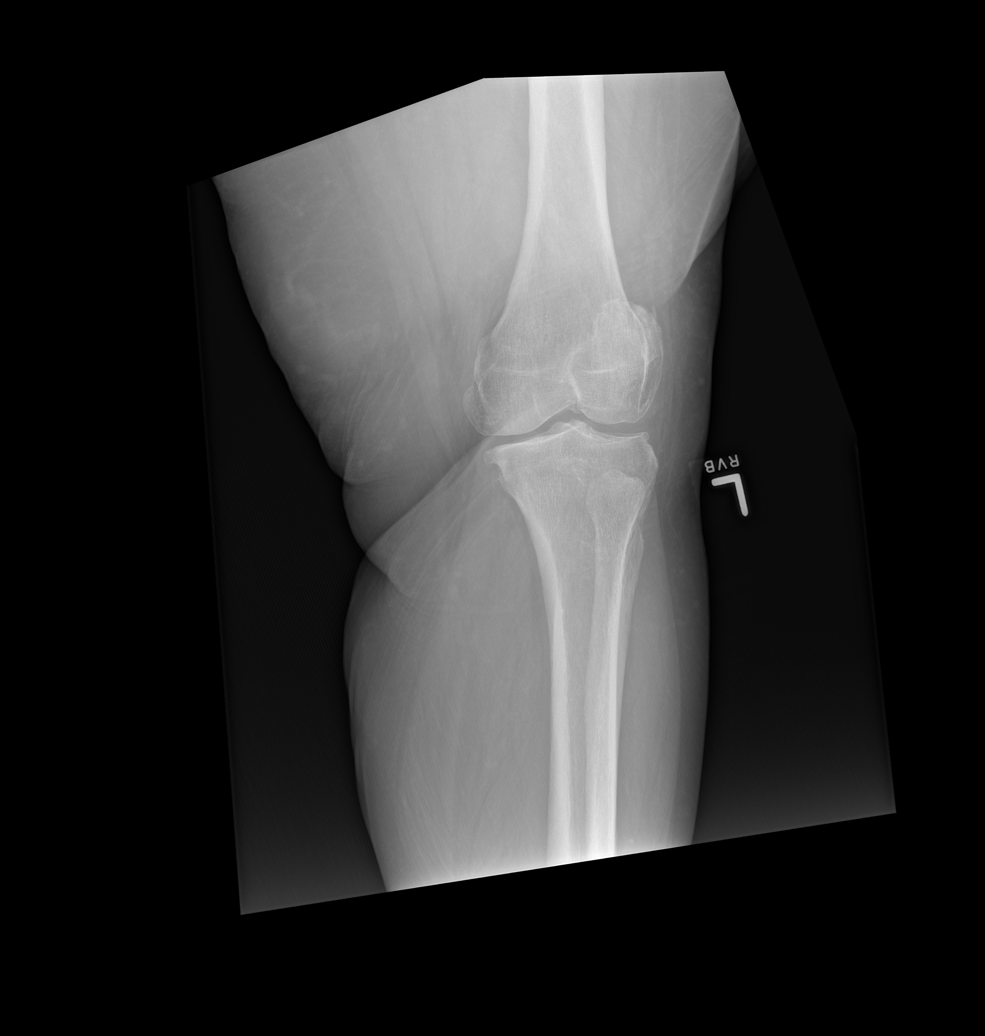

[x knee ap left (4 of 4)]
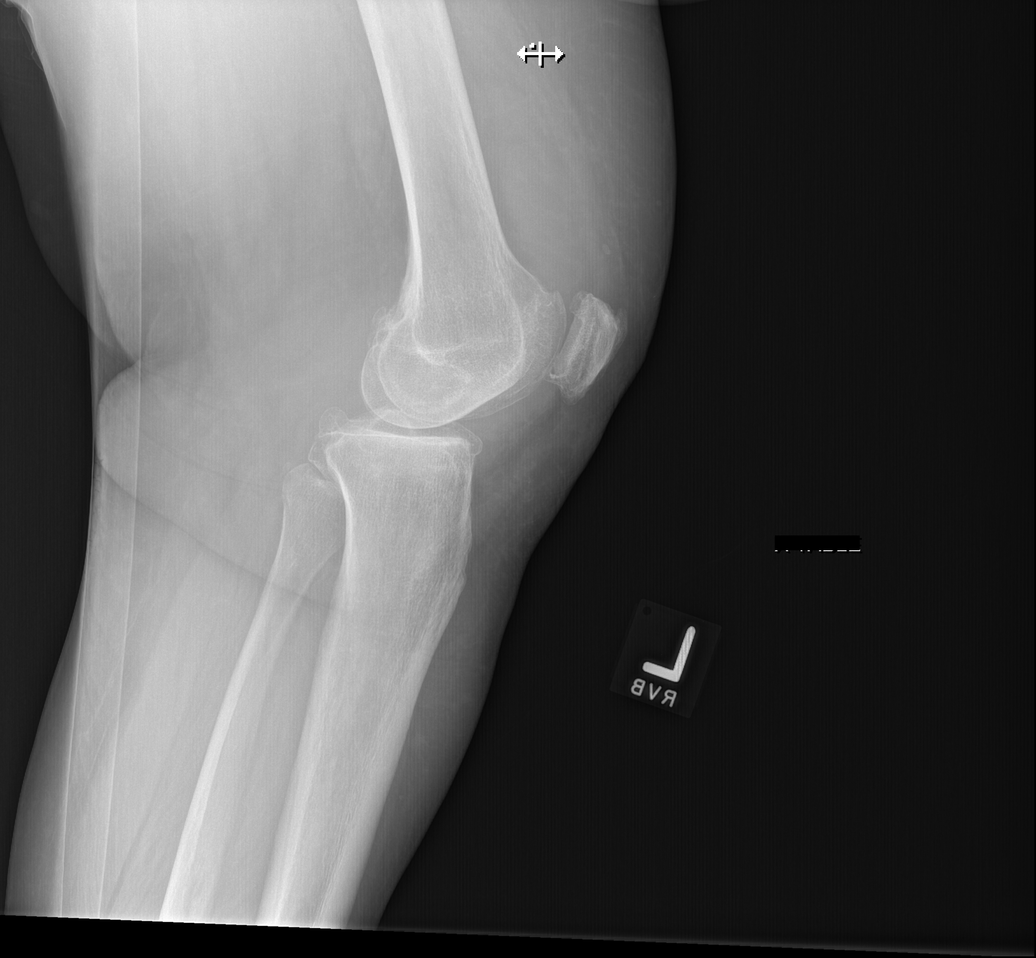

[4 of 4 positions shown; findings below may reference images not displayed]

FINDINGS: No acute fracture or subluxation. Tricompartmental osteoarthritis
with peripheral spurring. Mild medial tibiofemoral joint space
narrowing. No evidence of focal lesion or bony destructive change.
Quadriceps tendon enthesophyte. There is a moderate joint effusion.
IMPRESSION: 1. Mild to moderate tricompartmental osteoarthritis. No acute bony
abnormality.
2. Moderate joint effusion.

## 2023-04-07 LAB — EXTERNAL GENERIC LAB PROCEDURE: COLOGUARD: NEGATIVE

## 2023-04-07 LAB — COLOGUARD: COLOGUARD: NEGATIVE

## 2023-11-02 ENCOUNTER — Other Ambulatory Visit: Payer: Self-pay

## 2023-11-02 ENCOUNTER — Encounter (HOSPITAL_COMMUNITY): Payer: Self-pay

## 2023-11-02 ENCOUNTER — Emergency Department (HOSPITAL_COMMUNITY): Payer: Medicare HMO

## 2023-11-02 ENCOUNTER — Emergency Department (HOSPITAL_COMMUNITY)
Admission: EM | Admit: 2023-11-02 | Discharge: 2023-11-02 | Disposition: A | Discharge status: Home / Self Care | Payer: Medicare HMO | Attending: Emergency Medicine | Admitting: Emergency Medicine

## 2023-11-02 DIAGNOSIS — R739 Hyperglycemia, unspecified: Secondary | ICD-10-CM

## 2023-11-02 DIAGNOSIS — E1165 Type 2 diabetes mellitus with hyperglycemia: Secondary | ICD-10-CM | POA: Insufficient documentation

## 2023-11-02 DIAGNOSIS — Z79899 Other long term (current) drug therapy: Secondary | ICD-10-CM | POA: Insufficient documentation

## 2023-11-02 DIAGNOSIS — Z7984 Long term (current) use of oral hypoglycemic drugs: Secondary | ICD-10-CM | POA: Insufficient documentation

## 2023-11-02 DIAGNOSIS — G510 Bell's palsy: Secondary | ICD-10-CM | POA: Insufficient documentation

## 2023-11-02 DIAGNOSIS — I10 Essential (primary) hypertension: Secondary | ICD-10-CM | POA: Diagnosis not present

## 2023-11-02 DIAGNOSIS — R29810 Facial weakness: Secondary | ICD-10-CM | POA: Diagnosis present

## 2023-11-02 LAB — CBC WITH DIFFERENTIAL/PLATELET
Abs Immature Granulocytes: 0.03 10*3/uL (ref 0.00–0.07)
Basophils Absolute: 0 10*3/uL (ref 0.0–0.1)
Basophils Relative: 1 %
Eosinophils Absolute: 0.5 10*3/uL (ref 0.0–0.5)
Eosinophils Relative: 6 %
HCT: 42.4 % (ref 36.0–46.0)
Hemoglobin: 13.9 g/dL (ref 12.0–15.0)
Immature Granulocytes: 0 %
Lymphocytes Relative: 37 %
Lymphs Abs: 3 10*3/uL (ref 0.7–4.0)
MCH: 29.3 pg (ref 26.0–34.0)
MCHC: 32.8 g/dL (ref 30.0–36.0)
MCV: 89.3 fL (ref 80.0–100.0)
Monocytes Absolute: 0.5 10*3/uL (ref 0.1–1.0)
Monocytes Relative: 7 %
Neutro Abs: 4 10*3/uL (ref 1.7–7.7)
Neutrophils Relative %: 49 %
Platelets: 336 10*3/uL (ref 150–400)
RBC: 4.75 MIL/uL (ref 3.87–5.11)
RDW: 13.7 % (ref 11.5–15.5)
WBC: 8.1 10*3/uL (ref 4.0–10.5)
nRBC: 0 % (ref 0.0–0.2)

## 2023-11-02 LAB — BASIC METABOLIC PANEL
Anion gap: 12 (ref 5–15)
BUN: 11 mg/dL (ref 8–23)
CO2: 29 mmol/L (ref 22–32)
Calcium: 8.4 mg/dL — ABNORMAL LOW (ref 8.9–10.3)
Chloride: 97 mmol/L — ABNORMAL LOW (ref 98–111)
Creatinine, Ser: 0.73 mg/dL (ref 0.44–1.00)
GFR, Estimated: >60 mL/min (ref >60)
Glucose, Bld: 328 mg/dL — ABNORMAL HIGH (ref 70–99)
Potassium: 3 mmol/L — ABNORMAL LOW (ref 3.5–5.1)
Sodium: 138 mmol/L (ref 135–145)

## 2023-11-02 LAB — CBG MONITORING, ED: Glucose-Capillary: 287 mg/dL — ABNORMAL HIGH (ref 70–99)

## 2023-11-02 MED ORDER — VALACYCLOVIR HCL 1 G PO TABS: 1000.0000 mg | ORAL_TABLET | Freq: Three times a day (TID) | ORAL | 0 refills | Status: AC

## 2023-11-02 MED ORDER — PREDNISONE 20 MG PO TABS
60.0000 mg | ORAL_TABLET | Freq: Once | ORAL | Status: AC
  Administered 2023-11-02: 60 mg via ORAL
  Filled 2023-11-02: qty 3

## 2023-11-02 MED ORDER — POTASSIUM CHLORIDE CRYS ER 20 MEQ PO TBCR
40.0000 meq | EXTENDED_RELEASE_TABLET | Freq: Once | ORAL | Status: AC
  Administered 2023-11-02: 40 meq via ORAL
  Filled 2023-11-02: qty 2

## 2023-11-02 MED ORDER — PREDNISONE 20 MG PO TABS: 40.0000 mg | ORAL_TABLET | Freq: Every day | ORAL | 0 refills | Status: AC

## 2023-11-02 NOTE — ED Provider Triage Note (Signed)
Emergency Medicine Provider Triage Evaluation Note  Ann Holt , a 62 y.o. female  was evaluated in triage.  Pt complains of facial weakness   Review of Systems  Positive: weakness Negative: fever  Physical Exam  BP (!) 176/111 (BP Location: Right Arm)   Pulse (!) 104   Temp 98.6 F (37 C)   Resp 20   Ht 5\' 1"  (1.549 m)   Wt 95.3 kg   LMP 12/26/2012   SpO2 100%   BMI 39.68 kg/m  Gen:   Awake, no distress   Resp:  Normal effort  MSK:   Moves extremities without difficulty  Other:    Medical Decision Making  Medically screening exam initiated at 3:11 PM.  Appropriate orders placed.  Ann Holt was informed that the remainder of the evaluation will be completed by another provider, this initial triage assessment does not replace that evaluation, and the importance of remaining in the ED until their evaluation is complete.     Elson Areas, [place] 11/02/23 1514

## 2023-11-02 NOTE — Discharge Instructions (Signed)
As we discussed, you have Bell's palsy.  I recommend that he take prednisone as prescribed  I also recommend that you take Valtrex as prescribed  Your left eye may feel more dry so I recommend putting lubricant eyedrops to the left eye and put a eye shade or tape your eye at night  See your doctor for follow-up  The prednisone may get your blood sugar elevated so please check it often and give yourself insulin as prescribed  Return to ER if you have worse facial droop or trouble speaking or numbness or weakness

## 2023-11-02 NOTE — ED Provider Notes (Signed)
[place] EMERGENCY DEPARTMENT AT [facility] Provider Note   CSN: [id number] Arrival date & time: 11/02/23  1452     History  Chief Complaint  Patient presents with   Facial Droop    Ann Holt is a 62 y.o. female history of diabetes, hypertension here presenting with left facial droop.  Patient states that she went to bed around 4:30 AM.  She states that when she went to bed, she was feeling fine.  She states that her daughter woke her around 58 AM.  She went to the bathroom and noticed left facial droop.  She states that she has no trouble speaking.  She states that she also felt that her left eye was a little dry.  She has some sinus congestion for the last several days.  She was concerned that she may have Bell's palsy.  Denies any arm or leg weakness  The history is provided by the patient.       Home Medications Prior to Admission medications   Medication Sig Start Date End Date Taking? Authorizing Provider  albuterol (PROVENTIL HFA;VENTOLIN HFA) 108 (90 BASE) MCG/ACT inhaler Inhale 2 puffs into the lungs every 6 (six) hours as needed. For shortness of breath.    [provider]  glipiZIDE (GLUCOTROL XL) 10 MG 24 hr tablet Take 10 mg by mouth daily with breakfast.  07/28/14 07/28/15  [provider]  HYDROcodone-acetaminophen (NORCO/VICODIN) 5-325 MG tablet Take 1-2 tablets by mouth every 6 hours as needed for pain. 11/24/16   Pisciotta, Joni Reining, PA-C  ibuprofen (ADVIL,MOTRIN) 200 MG tablet Take 400 mg by mouth every 8 (eight) hours as needed for moderate pain. For pain.    [provider]  lisinopril (PRINIVIL,ZESTRIL) 10 MG tablet Take 10 mg by mouth daily.  08/25/14   [provider]  loratadine-pseudoephedrine (CLARITIN-D 24-HOUR) 10-240 MG per 24 hr tablet Take 1 tablet by mouth daily as needed for allergies. For allergies.    [provider]  metFORMIN (GLUCOPHAGE) 1000 MG tablet Take 1,000 mg by mouth 2 (two) times  daily with a meal.  08/25/14 08/25/15  [provider]  montelukast (SINGULAIR) 10 MG tablet Take 10 mg by mouth at bedtime.    [provider]      Allergies    Penicillins    Review of Systems   Review of Systems  Neurological:  Positive for facial asymmetry.  All other systems reviewed and are negative.   Physical Exam Updated Vital Signs BP (!) 176/111 (BP Location: Right Arm)   Pulse (!) 104   Temp 98.6 F (37 C)   Resp 20   Ht 5\' 1"  (1.549 m)   Wt 95.3 kg   LMP 12/26/2012   SpO2 100%   BMI 39.68 kg/m  Physical Exam Vitals and nursing note reviewed.  HENT:     Head: Normocephalic.     Nose: Nose normal.     Mouth/Throat:     Mouth: Mucous membranes are moist.  Eyes:     Extraocular Movements: Extraocular movements intact.     Pupils: Pupils are equal, round, and reactive to light.  Cardiovascular:     Rate and Rhythm: Normal rate and regular rhythm.     Pulses: Normal pulses.     Heart sounds: Normal heart sounds.  Pulmonary:     Effort: Pulmonary effort is normal.     Breath sounds: Normal breath sounds.  Abdominal:     General: Abdomen is flat.  Palpations: Abdomen is soft.  Musculoskeletal:        General: Normal range of motion.     Cervical back: Normal range of motion.  Skin:    General: Skin is warm.  Neurological:     Mental Status: She is alert.     Comments: Patient has obvious left facial droop.  Patient also has hard time closing her left eye.  Patient has slightly decreased sensation on left face.  Normal strength and sensation bilateral arms and legs.  Psychiatric:        Mood and Affect: Mood normal.        Behavior: Behavior normal.     ED Results / Procedures / Treatments   Labs (all labs ordered are listed, but only abnormal results are displayed) Labs Reviewed  CBG MONITORING, ED - Abnormal; Notable for the following components:      Result Value   Glucose-Capillary 287 (*)    All other components within  normal limits  CBC WITH DIFFERENTIAL/PLATELET  BASIC METABOLIC PANEL    EKG EKG Interpretation Date/Time:  Monday November 02 2023 15:00:04 EST Ventricular Rate:  105 PR Interval:  120 QRS Duration:  76 QT Interval:  368 QTC Calculation: 486 R Axis:   75  Text Interpretation: Sinus tachycardia with Premature supraventricular complexes Possible Anterior infarct , age undetermined Abnormal ECG When compared with ECG of 24-Jun-2015 21:26, PREVIOUS ECG IS PRESENT Confirmed by Richardean Canal [phone]) on 11/02/2023 3:37:35 PM  Radiology No results found.  Procedures Procedures    Medications Ordered in ED Medications  predniSONE (DELTASONE) tablet 60 mg (has no administration in time range)    ED Course/ Medical Decision Making/ A&P                                 Medical Decision Making Ann Holt is a 62 y.o. female here presenting with Bell's palsy.  Patient has obvious Bell's palsy.  Last normal was around 4:30 AM.  Patient has no other signs of stroke.  Plan to get CT head and if is negative do not need MRI or CTA.  Will get labs.  6:05 PM Reviewed patient's labs and glucose is 300.  Patient's anion gap is normal.  CT showed no acute stroke.  I think patient likely has Bell's palsy.  Will give a course of steroids and Valtrex.  I told patient that her glucose may be elevated and told her to check her sugar frequently and give herself insulin as prescribed by her doctor  Problems Addressed: Bell's palsy: acute illness or injury Hyperglycemia: acute illness or injury  Amount and/or Complexity of Data Reviewed Labs: ordered. Decision-making details documented in ED Course. Radiology: ordered and independent interpretation performed. Decision-making details documented in ED Course.  Risk Prescription drug management.    Final Clinical Impression(s) / ED Diagnoses Final diagnoses:  None    Rx / DC Orders ED Discharge Orders     None         Charlynne Pander, MD 11/02/23 1807

## 2023-11-02 NOTE — ED Triage Notes (Signed)
Pt states she woke up at 0430 this am because her left eye felt like it had something in it and then went back to sleep. Pt states she woke up at 1130 with left sided facial drooping. Pt has numbness in left side of face.

## 2024-04-14 ENCOUNTER — Emergency Department (HOSPITAL_COMMUNITY)

## 2024-04-14 ENCOUNTER — Emergency Department (HOSPITAL_COMMUNITY)
Admission: EM | Admit: 2024-04-14 | Discharge: 2024-04-14 | Disposition: A | Discharge status: Home / Self Care | Attending: Emergency Medicine | Admitting: Emergency Medicine

## 2024-04-14 ENCOUNTER — Encounter (HOSPITAL_COMMUNITY): Payer: Self-pay

## 2024-04-14 ENCOUNTER — Other Ambulatory Visit: Payer: Self-pay

## 2024-04-14 DIAGNOSIS — Z79899 Other long term (current) drug therapy: Secondary | ICD-10-CM | POA: Insufficient documentation

## 2024-04-14 DIAGNOSIS — R0600 Dyspnea, unspecified: Secondary | ICD-10-CM | POA: Diagnosis present

## 2024-04-14 DIAGNOSIS — Z7984 Long term (current) use of oral hypoglycemic drugs: Secondary | ICD-10-CM | POA: Insufficient documentation

## 2024-04-14 DIAGNOSIS — E119 Type 2 diabetes mellitus without complications: Secondary | ICD-10-CM | POA: Insufficient documentation

## 2024-04-14 DIAGNOSIS — I10 Essential (primary) hypertension: Secondary | ICD-10-CM | POA: Diagnosis not present

## 2024-04-14 DIAGNOSIS — J4520 Mild intermittent asthma, uncomplicated: Secondary | ICD-10-CM | POA: Diagnosis not present

## 2024-04-14 LAB — BASIC METABOLIC PANEL WITH GFR
Anion gap: 10 (ref 5–15)
BUN: 12 mg/dL (ref 8–23)
CO2: 27 mmol/L (ref 22–32)
Calcium: 8.8 mg/dL — ABNORMAL LOW (ref 8.9–10.3)
Chloride: 104 mmol/L (ref 98–111)
Creatinine, Ser: 0.72 mg/dL (ref 0.44–1.00)
GFR, Estimated: >60 mL/min (ref >60)
Glucose, Bld: 225 mg/dL — ABNORMAL HIGH (ref 70–99)
Potassium: 3.7 mmol/L (ref 3.5–5.1)
Sodium: 141 mmol/L (ref 135–145)

## 2024-04-14 LAB — CBC
HCT: 42.2 % (ref 36.0–46.0)
Hemoglobin: 13.5 g/dL (ref 12.0–15.0)
MCH: 29.2 pg (ref 26.0–34.0)
MCHC: 32 g/dL (ref 30.0–36.0)
MCV: 91.3 fL (ref 80.0–100.0)
Platelets: 324 10*3/uL (ref 150–400)
RBC: 4.62 MIL/uL (ref 3.87–5.11)
RDW: 13.9 % (ref 11.5–15.5)
WBC: 8 10*3/uL (ref 4.0–10.5)
nRBC: 0 % (ref 0.0–0.2)

## 2024-04-14 LAB — D-DIMER, QUANTITATIVE: D-Dimer, Quant: 0.68 ug{FEU}/mL — ABNORMAL HIGH (ref 0.00–0.50)

## 2024-04-14 MED ORDER — ALBUTEROL SULFATE HFA 108 (90 BASE) MCG/ACT IN AERS
2.0000 | INHALATION_SPRAY | RESPIRATORY_TRACT | Status: DC | PRN
  Administered 2024-04-14: 2 via RESPIRATORY_TRACT
  Filled 2024-04-14: qty 6.7

## 2024-04-14 MED ORDER — IOHEXOL 350 MG/ML SOLN
75.0000 mL | Freq: Once | INTRAVENOUS | Status: AC | PRN
Start: 2024-04-14 — End: 2024-04-14
  Administered 2024-04-14: 75 mL via INTRAVENOUS

## 2024-04-14 MED ORDER — PREDNISONE 10 MG PO TABS: 20.0000 mg | ORAL_TABLET | Freq: Every day | ORAL | 0 refills | Status: DC

## 2024-04-14 MED ORDER — METHYLPREDNISOLONE SODIUM SUCC 125 MG IJ SOLR
125.0000 mg | Freq: Once | INTRAMUSCULAR | Status: AC
  Administered 2024-04-14: 125 mg via INTRAVENOUS
  Filled 2024-04-14: qty 2

## 2024-04-14 NOTE — ED Notes (Signed)
Patient verbalizes understanding of discharge instructions. Opportunity for questioning and answers were provided. 

## 2024-04-14 NOTE — Discharge Instructions (Signed)
 Please continue to use your inhaler up to every 4 hours.  You are being started on prednisone .  Return if you are having any new or worsening symptoms.  Call your doctor for follow-up in the next 1 to 2 days.

## 2024-04-14 NOTE — ED Provider Notes (Signed)
 [place] EMERGENCY DEPARTMENT AT [facility] Provider Note   CSN: [id number] Arrival date & time: 04/14/24  4098     History  No chief complaint on file.   Ann Holt is a 63 y.o. female.  HPI 63 year old female history of asthma, diabetes, hypertension, presents today complaining of wheezing and dyspnea.  She states she is darted having some wheezing last night.  She used her albuterol  inhaler with some relief and then again having symptoms again later in the evening.  She again used the inhaler.  She felt worse this morning and called 911.  She was noted to have expiratory wheezing in her upper lobes and was given DuoNebs prior to arrival with improvement of her wheezing.  She was placed on oxygen prehospital by EMS and oxygen saturations are 96%.  She reports not being on oxygen at home.  She has not had to be hospitalized or be on steroids for her asthma for quite a while.    Home Medications Prior to Admission medications   Medication Sig Start Date End Date Taking? Authorizing Provider  predniSONE  (DELTASONE ) 10 MG tablet Take 2 tablets (20 mg total) by mouth daily. 04/14/24  Yes Auston Blush, MD  albuterol  (PROVENTIL  HFA;VENTOLIN  HFA) 108 (90 BASE) MCG/ACT inhaler Inhale 2 puffs into the lungs every 6 (six) hours as needed. For shortness of breath.    [provider]  glipiZIDE (GLUCOTROL XL) 10 MG 24 hr tablet Take 10 mg by mouth daily with breakfast.  07/28/14 07/28/15  [provider]  HYDROcodone -acetaminophen  (NORCO/VICODIN) 5-325 MG tablet Take 1-2 tablets by mouth every 6 hours as needed for pain. 11/24/16   Pisciotta, Peterson Brandt, PA-C  ibuprofen (ADVIL,MOTRIN) 200 MG tablet Take 400 mg by mouth every 8 (eight) hours as needed for moderate pain. For pain.    [provider]  lisinopril (PRINIVIL,ZESTRIL) 10 MG tablet Take 10 mg by mouth daily.  08/25/14   [provider]  loratadine-pseudoephedrine (CLARITIN-D 24-HOUR) 10-240 MG per  24 hr tablet Take 1 tablet by mouth daily as needed for allergies. For allergies.    [provider]  metFORMIN (GLUCOPHAGE) 1000 MG tablet Take 1,000 mg by mouth 2 (two) times daily with a meal.  08/25/14 08/25/15  [provider]  montelukast (SINGULAIR) 10 MG tablet Take 10 mg by mouth at bedtime.    [provider]      Allergies    Penicillins    Review of Systems   Review of Systems  Physical Exam Updated Vital Signs BP (!) 156/69   Pulse 92   Temp 98.7 F (37.1 C) (Oral)   Resp 16   LMP 12/26/2012   SpO2 100%  Physical Exam Vitals and nursing note reviewed.  Constitutional:      Appearance: Normal appearance. She is normal weight.  HENT:     Head: Normocephalic and atraumatic.     Right Ear: External ear normal.     Left Ear: External ear normal.     Nose: Nose normal.     Mouth/Throat:     Pharynx: Oropharynx is clear.  Eyes:     Extraocular Movements: Extraocular movements intact.     Pupils: Pupils are equal, round, and reactive to light.  Cardiovascular:     Rate and Rhythm: Regular rhythm. Tachycardia present.     Pulses: Normal pulses.  Pulmonary:     Breath sounds: No stridor. No wheezing, rhonchi or rales.     Comments: Tachypnea noted Abdominal:  General: Abdomen is flat. Bowel sounds are normal.     Palpations: Abdomen is soft.  Musculoskeletal:        General: No swelling or deformity. Normal range of motion.     Cervical back: Normal range of motion and neck supple.     Right lower leg: No edema.     Left lower leg: No edema.  Skin:    General: Skin is warm and dry.     Capillary Refill: Capillary refill takes less than 2 seconds.  Neurological:     General: No focal deficit present.     Mental Status: She is alert.  Psychiatric:        Mood and Affect: Mood normal.     ED Results / Procedures / Treatments   Labs (all labs ordered are listed, but only abnormal results are displayed) Labs Reviewed  BASIC  METABOLIC PANEL WITH GFR - Abnormal; Notable for the following components:      Result Value   Glucose, Bld 225 (*)    Calcium 8.8 (*)    All other components within normal limits  D-DIMER, QUANTITATIVE - Abnormal; Notable for the following components:   D-Dimer, Quant 0.68 (*)    All other components within normal limits  CBC    EKG None  Radiology CT Angio Chest PE W and/or Wo Contrast Result Date: 04/14/2024 CLINICAL DATA:  Shortness of breath. EXAM: CT ANGIOGRAPHY CHEST WITH CONTRAST TECHNIQUE: Multidetector CT imaging of the chest was performed using the standard protocol during bolus administration of intravenous contrast. Multiplanar CT image reconstructions and MIPs were obtained to evaluate the vascular anatomy. RADIATION DOSE REDUCTION: This exam was performed according to the departmental dose-optimization program which includes automated exposure control, adjustment of the mA and/or kV according to patient size and/or use of iterative reconstruction technique. CONTRAST:  75mL OMNIPAQUE IOHEXOL 350 MG/ML SOLN COMPARISON:  None Available. FINDINGS: Cardiovascular: There is mild calcification of the aortic arch, without evidence of aortic aneurysm. The segmental and subsegmental bilateral lower lobe pulmonary arteries are limited in evaluation secondary to areas of overlying artifact. No evidence of pulmonary embolism. Normal heart size. No pericardial effusion. Mediastinum/Nodes: There is mild AP window, pretracheal and bilateral hilar lymphadenopathy. Thyroid gland, trachea, and esophagus demonstrate no significant findings. Lungs/Pleura: Mild to moderate severity diffuse interstitial thickening is noted. This is most prominent within the bilateral upper lobes. Mild lingular, anteromedial right middle lobe and posterior bilateral lower lobe scarring and/or atelectasis is seen. There are small bilateral pleural effusions, right greater than left. No pneumothorax is identified. Upper Abdomen:  No acute abnormality. Musculoskeletal: Multilevel degenerative changes are seen throughout the thoracic spine. This is most prominent at the level of T5-T6. Review of the MIP images confirms the above findings. IMPRESSION: 1. No evidence of pulmonary embolism. 2. Mild to moderate severity diffuse interstitial thickening, which may represent sequelae associated with interstitial edema. 3. Small bilateral pleural effusions, right greater than left. 4. Mild lingular, anteromedial right middle lobe and posterior bilateral lower lobe scarring and/or atelectasis. 5. Mild mediastinal and bilateral hilar lymphadenopathy, likely reactive in nature. 6. Aortic atherosclerosis. Electronically Signed   By: Virgle Grime M.D.   On: 04/14/2024 13:26   DG Chest Port 1 View Result Date: 04/14/2024 CLINICAL DATA:  Dyspnea EXAM: PORTABLE CHEST 1 VIEW COMPARISON:  06/24/2015 FINDINGS: Single frontal view of the chest demonstrates an enlarged cardiac silhouette, likely exaggerated by AP lordotic positioning. No airspace disease, effusion, or pneumothorax. There are no acute bony  abnormalities. IMPRESSION: 1. No acute intrathoracic process. Electronically Signed   By: Bobbye Burrow M.D.   On: 04/14/2024 09:53    Procedures Procedures    Medications Ordered in ED Medications  albuterol  (VENTOLIN  HFA) 108 (90 Base) MCG/ACT inhaler 2 puff (has no administration in time range)  methylPREDNISolone sodium succinate (SOLU-MEDROL) 125 mg/2 mL injection 125 mg (125 mg Intravenous Given 04/14/24 0937)  iohexol (OMNIPAQUE) 350 MG/ML injection 75 mL (75 mLs Intravenous Contrast Given 04/14/24 1302)    ED Course/ Medical Decision Making/ A&P Clinical Course as of 04/14/24 1340  Thu Apr 14, 2024  1232 D-dimer slightly elevated at 0.68 will obtain CTA [DR]  1330 CTA reviewed and no evidence of pulmonary embolism, they note mild to moderate severity diffuse interstitial thickening which may represent sequela associated with  interstitial edema, small bilateral pleural effusions with right greater than left, mild lingula anteromedial right middle lobe and posterior bilateral lobe scarring [DR]    Clinical Course User Index [DR] Auston Blush, MD                                 Medical Decision Making Amount and/or Complexity of Data Reviewed Labs: ordered. Radiology: ordered.  Risk Prescription drug management.   64 year old female with history of asthma Zentz today with some chest tightness, wheezing, and dyspnea.  Wheezing was reported prehospital had essentially resolved on my initial evaluation.  Patient was given additional albuterol  due to decreased air movement. She was reevaluated and has good air movement with no wheezing noted.  She received IV Solu-Medrol here prior to this.  However, she is tachycardic on my reevaluation.  Secondary to this and unclear as to whether or not she had wheezing earlier, I ordered D-dimer.  This is slightly elevated.  Patient will require CTA due to elevated D-dimer. If CTA is negative, patient will be stable for discharge to home and will be discharged to home on prednisone  and will continue her home albuterol  CTA reviewed and no evidence of PE noted. Patient appears stable for discharge to home       Final Clinical Impression(s) / ED Diagnoses Final diagnoses:  Dyspnea, unspecified type  Intermittent asthma, unspecified asthma severity, unspecified whether complicated    Rx / DC Orders ED Discharge Orders          Ordered    predniSONE  (DELTASONE ) 10 MG tablet  Daily        04/14/24 1304              Auston Blush, MD 04/14/24 1340

## 2024-04-14 NOTE — ED Triage Notes (Signed)
 Pt bib ems from home; hx asthma; sob since last night; used albuterol  inhaler with some relief; hr 100, BP 140 palp; expiratory wheezing upper lobes, duoneb pta, wheezing improved; cbg 206; 96% RA, rr 20

## 2024-05-24 NOTE — Progress Notes (Signed)
 This patient's chart has been reviewed by a [facility] Specialist.   Attempted to contact patient in order to discuss appointments and health maintenance screenings due for the upcoming year.   UTR (No voicemail / No phone number / Invalid phone number). and [employer] message with health maintenance recommendations and [facility] Specialist's contact information..  Additional Comments: rang 5x, no vm

## 2024-06-16 ENCOUNTER — Encounter (HOSPITAL_COMMUNITY): Payer: Self-pay | Admitting: Emergency Medicine

## 2024-06-16 ENCOUNTER — Emergency Department (HOSPITAL_COMMUNITY)

## 2024-06-16 ENCOUNTER — Other Ambulatory Visit: Payer: Self-pay

## 2024-06-16 ENCOUNTER — Inpatient Hospital Stay (HOSPITAL_COMMUNITY)
Admission: EM | Admit: 2024-06-16 | Discharge: 2024-06-20 | DRG: 286 | Disposition: A | Discharge status: Home / Self Care | Attending: Internal Medicine | Admitting: Internal Medicine

## 2024-06-16 DIAGNOSIS — I110 Hypertensive heart disease with heart failure: Principal | ICD-10-CM | POA: Diagnosis present

## 2024-06-16 DIAGNOSIS — I161 Hypertensive emergency: Secondary | ICD-10-CM | POA: Diagnosis present

## 2024-06-16 DIAGNOSIS — R590 Localized enlarged lymph nodes: Secondary | ICD-10-CM | POA: Diagnosis present

## 2024-06-16 DIAGNOSIS — J4521 Mild intermittent asthma with (acute) exacerbation: Secondary | ICD-10-CM

## 2024-06-16 DIAGNOSIS — Z79899 Other long term (current) drug therapy: Secondary | ICD-10-CM

## 2024-06-16 DIAGNOSIS — E1165 Type 2 diabetes mellitus with hyperglycemia: Secondary | ICD-10-CM | POA: Diagnosis present

## 2024-06-16 DIAGNOSIS — Z23 Encounter for immunization: Secondary | ICD-10-CM

## 2024-06-16 DIAGNOSIS — I5033 Acute on chronic diastolic (congestive) heart failure: Secondary | ICD-10-CM | POA: Diagnosis present

## 2024-06-16 DIAGNOSIS — R0602 Shortness of breath: Principal | ICD-10-CM

## 2024-06-16 DIAGNOSIS — E876 Hypokalemia: Secondary | ICD-10-CM | POA: Diagnosis present

## 2024-06-16 DIAGNOSIS — J810 Acute pulmonary edema: Secondary | ICD-10-CM

## 2024-06-16 DIAGNOSIS — I169 Hypertensive crisis, unspecified: Secondary | ICD-10-CM

## 2024-06-16 DIAGNOSIS — E66812 Obesity, class 2: Secondary | ICD-10-CM | POA: Diagnosis present

## 2024-06-16 DIAGNOSIS — E785 Hyperlipidemia, unspecified: Secondary | ICD-10-CM | POA: Diagnosis present

## 2024-06-16 DIAGNOSIS — Z823 Family history of stroke: Secondary | ICD-10-CM

## 2024-06-16 DIAGNOSIS — E1169 Type 2 diabetes mellitus with other specified complication: Secondary | ICD-10-CM | POA: Diagnosis present

## 2024-06-16 DIAGNOSIS — Z7984 Long term (current) use of oral hypoglycemic drugs: Secondary | ICD-10-CM

## 2024-06-16 DIAGNOSIS — Z794 Long term (current) use of insulin: Secondary | ICD-10-CM

## 2024-06-16 DIAGNOSIS — E782 Mixed hyperlipidemia: Secondary | ICD-10-CM

## 2024-06-16 DIAGNOSIS — J45909 Unspecified asthma, uncomplicated: Secondary | ICD-10-CM

## 2024-06-16 DIAGNOSIS — Z6841 Body Mass Index (BMI) 40.0 and over, adult: Secondary | ICD-10-CM

## 2024-06-16 DIAGNOSIS — Z880 Allergy status to penicillin: Secondary | ICD-10-CM

## 2024-06-16 DIAGNOSIS — Z7985 Long-term (current) use of injectable non-insulin antidiabetic drugs: Secondary | ICD-10-CM

## 2024-06-16 DIAGNOSIS — I5031 Acute diastolic (congestive) heart failure: Secondary | ICD-10-CM | POA: Diagnosis present

## 2024-06-16 DIAGNOSIS — Z7952 Long term (current) use of systemic steroids: Secondary | ICD-10-CM

## 2024-06-16 DIAGNOSIS — Z1152 Encounter for screening for COVID-19: Secondary | ICD-10-CM

## 2024-06-16 DIAGNOSIS — I10 Essential (primary) hypertension: Secondary | ICD-10-CM | POA: Insufficient documentation

## 2024-06-16 HISTORY — DX: Essential (primary) hypertension: I10

## 2024-06-16 LAB — BASIC METABOLIC PANEL WITH GFR
Anion gap: 9 (ref 5–15)
BUN: 13 mg/dL (ref 8–23)
CO2: 26 mmol/L (ref 22–32)
Calcium: 8.2 mg/dL — ABNORMAL LOW (ref 8.9–10.3)
Chloride: 105 mmol/L (ref 98–111)
Creatinine, Ser: 0.75 mg/dL (ref 0.44–1.00)
GFR, Estimated: >60 mL/min (ref >60)
Glucose, Bld: 208 mg/dL — ABNORMAL HIGH (ref 70–99)
Potassium: 3.6 mmol/L (ref 3.5–5.1)
Sodium: 140 mmol/L (ref 135–145)

## 2024-06-16 LAB — CBC
HCT: 45.6 % (ref 36.0–46.0)
Hemoglobin: 14.6 g/dL (ref 12.0–15.0)
MCH: 29.1 pg (ref 26.0–34.0)
MCHC: 32 g/dL (ref 30.0–36.0)
MCV: 90.8 fL (ref 80.0–100.0)
Platelets: 322 K/uL (ref 150–400)
RBC: 5.02 MIL/uL (ref 3.87–5.11)
RDW: 14.2 % (ref 11.5–15.5)
WBC: 7.4 K/uL (ref 4.0–10.5)
nRBC: 0 % (ref 0.0–0.2)

## 2024-06-16 NOTE — ED Provider Notes (Signed)
 [place] EMERGENCY DEPARTMENT AT [facility] Provider Note   CSN: [id number] Arrival date & time: 06/16/24  2203     Patient presents with: Shortness of Breath  HPI Ann Holt is a 63 y.o. female with history of asthma, diabetes and hypertension presenting for shortness of breath it has been going on for 2 days.  It is intermittent and without chest pain.  She states she noticed some wheezing earlier today and used her inhaler with minimal relief.  Also endorses a nonproductive cough that started a couple days ago as well.  Denies fever.  Denies any lower extremity edema.  Denies calf tenderness, recent long trips or immobilization, or known history of blood clots.    Shortness of Breath      Prior to Admission medications   Medication Sig Start Date End Date Taking? Authorizing Provider  albuterol  (PROVENTIL  HFA;VENTOLIN  HFA) 108 (90 BASE) MCG/ACT inhaler Inhale 2 puffs into the lungs every 6 (six) hours as needed. For shortness of breath.    [provider]  glipiZIDE  (GLUCOTROL  XL) 10 MG 24 hr tablet Take 10 mg by mouth daily with breakfast.  07/28/14 07/28/15  [provider]  HYDROcodone -acetaminophen  (NORCO/VICODIN) 5-325 MG tablet Take 1-2 tablets by mouth every 6 hours as needed for pain. 11/24/16   Pisciotta, Nat, PA-C  ibuprofen (ADVIL,MOTRIN) 200 MG tablet Take 400 mg by mouth every 8 (eight) hours as needed for moderate pain. For pain.    [provider]  lisinopril  (PRINIVIL ,ZESTRIL ) 10 MG tablet Take 10 mg by mouth daily.  08/25/14   [provider]  loratadine -pseudoephedrine (CLARITIN -D 24-HOUR) 10-240 MG per 24 hr tablet Take 1 tablet by mouth daily as needed for allergies. For allergies.    [provider]  metFORMIN (GLUCOPHAGE) 1000 MG tablet Take 1,000 mg by mouth 2 (two) times daily with a meal.  08/25/14 08/25/15  [provider]  montelukast (SINGULAIR) 10 MG tablet Take 10 mg by mouth at bedtime.     [provider]  predniSONE  (DELTASONE ) 10 MG tablet Take 2 tablets (20 mg total) by mouth daily. 04/14/24   Levander Houston, MD    Allergies: Penicillins    Review of Systems  Respiratory:  Positive for shortness of breath.     Updated Vital Signs BP (!) 179/98 (BP Location: Right Arm)   Pulse 93   Temp 97.7 F (36.5 C) (Oral)   Resp 15   Ht 5' 2 (1.575 m)   Wt 102.1 kg   LMP 12/26/2012   SpO2 100%   BMI 41.15 kg/m   Physical Exam Vitals and nursing note reviewed.  HENT:     Head: Normocephalic and atraumatic.     Mouth/Throat:     Mouth: Mucous membranes are moist.  Eyes:     General:        Right eye: No discharge.        Left eye: No discharge.     Conjunctiva/sclera: Conjunctivae normal.  Cardiovascular:     Rate and Rhythm: Normal rate and regular rhythm.     Pulses: Normal pulses.     Heart sounds: Normal heart sounds.  Pulmonary:     Effort: Pulmonary effort is normal.     Breath sounds: Examination of the right-lower field reveals decreased breath sounds. Examination of the left-lower field reveals decreased breath sounds. Decreased breath sounds present. No wheezing, rhonchi or rales.  Abdominal:     General: Abdomen is flat.  Palpations: Abdomen is soft.  Musculoskeletal:     Right lower leg: No edema.     Left lower leg: No edema.  Skin:    General: Skin is warm and dry.  Neurological:     General: No focal deficit present.  Psychiatric:        Mood and Affect: Mood normal.     (all labs ordered are listed, but only abnormal results are displayed) Labs Reviewed  BASIC METABOLIC PANEL WITH GFR - Abnormal; Notable for the following components:      Result Value   Glucose, Bld 208 (*)    Calcium 8.2 (*)    All other components within normal limits  BRAIN NATRIURETIC PEPTIDE - Abnormal; Notable for the following components:   B Natriuretic Peptide 495.7 (*)    All other components within normal limits  D-DIMER, QUANTITATIVE -  Abnormal; Notable for the following components:   D-Dimer, Quant 1.24 (*)    All other components within normal limits  CBC  MAGNESIUM  TROPONIN I (HIGH SENSITIVITY)    EKG: None  Radiology: CT Angio Chest PE W/Cm &/Or Wo Cm Result Date: 06/17/2024 CLINICAL DATA:  Pulmonary embolism (PE) suspected, high prob OB x2 days. Hx of asthma, HTN and DBM. Pt used inhaler x2 tonight with some relief. Per EMS lungs clear, EMS vitals 95% RA, 180/110 BP has not taken meds, 250 CBG. PT states ran out of nebulizer solution EXAM: CT ANGIOGRAPHY CHEST WITH CONTRAST TECHNIQUE: Multidetector CT imaging of the chest was performed using the standard protocol during bolus administration of intravenous contrast. Multiplanar CT image reconstructions and MIPs were obtained to evaluate the vascular anatomy. RADIATION DOSE REDUCTION: This exam was performed according to the departmental dose-optimization program which includes automated exposure control, adjustment of the mA and/or kV according to patient size and/or use of iterative reconstruction technique. CONTRAST:  75mL OMNIPAQUE  IOHEXOL  350 MG/ML SOLN COMPARISON:  CT angio chest 04/14/2024 FINDINGS: Cardiovascular: Satisfactory opacification of the pulmonary arteries to the segmental level. No evidence of pulmonary embolism. Prominent heart size. No significant pericardial effusion. The thoracic aorta is normal in caliber. No atherosclerotic plaque of the thoracic aorta. No coronary artery calcifications. Mediastinum/Nodes: Persistent mediastinal lymphadenopathy with as an example a subcarinal 1 cm lymph node and a pre-vascular 1.4 cm lymph node. Persistent bilateral hilar lymphadenopathy with a 1.5 cm right lymph node. No axillary lymphadenopathy. Thyroid gland, trachea, and esophagus demonstrate no significant findings. Lungs/Pleura: Mild interlobular septal wall thickening. Right middle lobe opacity (11:13). No pulmonary nodule. No pulmonary mass. Small right pleural  effusion. Trace left pleural effusion. No pneumothorax. Upper Abdomen: No acute abnormality. Musculoskeletal: No chest wall abnormality. No suspicious lytic or blastic osseous lesions. No acute displaced fracture. Multilevel degenerative changes of the spine. Review of the MIP images confirms the above findings. IMPRESSION: 1. No pulmonary embolus. 2. Pulmonary edema with small right and trace left pleural effusion. 3. Right middle lobe consolidation versus atelectasis. 4. Persistent bilateral hilar and mediastinal lymphadenopathy. Unclear etiology. Additional imaging evaluation or consultation with Pulmonology recommended. Electronically Signed   By: Morgane  Naveau M.D.   On: 06/17/2024 01:55   DG Chest 2 View Result Date: 06/16/2024 CLINICAL DATA:  Shortness of breath EXAM: CHEST - 2 VIEW COMPARISON:  Radiograph and CT 04/14/2024 FINDINGS: Cardiomegaly. Pulmonary vascular congestion. Right basilar airspace opacities. No pleural effusion or pneumothorax. IMPRESSION: Right basilar airspace opacities may be due to atelectasis or pneumonia. Cardiomegaly and pulmonary vascular congestion. Electronically Signed   By: Norman  Stutzman M.D.   On: 06/16/2024 23:05     Procedures   Medications Ordered in the ED  furosemide  (LASIX ) injection 40 mg (has no administration in time range)  potassium chloride  SA (KLOR-CON  M) CR tablet 40 mEq (has no administration in time range)  doxycycline  (VIBRA -TABS) tablet 100 mg (has no administration in time range)  iohexol  (OMNIPAQUE ) 350 MG/ML injection 75 mL (75 mLs Intravenous Contrast Given 06/17/24 0136)                                    Medical Decision Making Amount and/or Complexity of Data Reviewed Labs: ordered. Radiology: ordered.  Risk Prescription drug management. Decision regarding hospitalization.   Initial Impression and Ddx 63 yo well appearing female presenting for shob. Exam notable for diminished breath sounds. Ddx includes COPD/asthma  exacerbation, CHF exacerbation, PE, pneumonia, other.  Patient PMH that increases complexity of ED encounter:  history of asthma, diabetes and hypertension  Interpretation of Diagnostics - I independent reviewed and interpreted the labs as followed: BNP elevated 495, elevated d dimer, negative troponin  - I independently visualized the following imaging with scope of interpretation limited to determining acute life threatening conditions related to emergency care: CT angio chest, which revealed no PE, small bilateral pleural effusions, right lobe consolidation  - I personally reviewed and interpreted EKG which revealed sinus tachycardia  Patient Reassessment and Ultimate Disposition/Management Given persistent shortness of breath without wheezing, elevated BNP, small bilateral pleural effusions, workup mostly suggestive of CHF exacerbation.  Gave IV Lasix .  Per chart review no prior history of heart failure.  CT also concerning for possible pneumonia.  Workup does not suggest associated sepsis.  Started on doxycycline .  Admitted to hospital service with Dr. Eva Pore.  Patient management required discussion with the following services or consulting groups:  Hospitalist Service  Complexity of Problems Addressed Acute complicated illness or Injury  Additional Data Reviewed and Analyzed Further history obtained from: Past medical history and medications listed in the EMR and Prior ED visit notes  Patient Encounter Risk Assessment Consideration of hospitalization      Final diagnoses:  SOB (shortness of breath)    ED Discharge Orders     None          Lang Norleen POUR, PA-C 06/17/24 0302    Mesner, Selinda, MD 06/17/24 [phone]

## 2024-06-16 NOTE — ED Triage Notes (Signed)
 Pt to ED via [facility] from home c/o intermittent SOB x2 days.  Hx of asthma, HTN and DBM.  Pt used inhaler x2 tonight with some relief.  Per EMS lungs clear, EMS vitals 95% RA, 180/110 BP has not taken meds, 250 CBG.  PT states ran out of nebulizer solution and needs refill.  Pt A&Ox4, chest rise even and unlabored, skin WNL and in NAD at this time.

## 2024-06-16 NOTE — ED Provider Notes (Incomplete)
 [place] EMERGENCY DEPARTMENT AT [facility] Provider Note   CSN: [id number] Arrival date & time: 06/16/24  2203     Patient presents with: Shortness of Breath  HPI Ann Holt is a 63 y.o. female with history of asthma, diabetes and hypertension presenting for shortness of breath it has been going on for 2 days.  It is intermittent and without chest pain.  She states she noticed some wheezing earlier today and used her inhaler with minimal relief.  Also endorses a nonproductive cough that started a couple days ago as well.  Denies fever.  Denies any lower extremity edema.  Denies calf tenderness, recent long trips or immobilization, or known history of blood clots.  {Add pertinent medical, surgical, social history, OB history to HPI:32947}  Shortness of Breath      Prior to Admission medications   Medication Sig Start Date End Date Taking? Authorizing Provider  albuterol  (PROVENTIL  HFA;VENTOLIN  HFA) 108 (90 BASE) MCG/ACT inhaler Inhale 2 puffs into the lungs every 6 (six) hours as needed. For shortness of breath.    [provider]  glipiZIDE  (GLUCOTROL  XL) 10 MG 24 hr tablet Take 10 mg by mouth daily with breakfast.  07/28/14 07/28/15  [provider]  HYDROcodone -acetaminophen  (NORCO/VICODIN) 5-325 MG tablet Take 1-2 tablets by mouth every 6 hours as needed for pain. 11/24/16   Pisciotta, Nat, PA-C  ibuprofen (ADVIL,MOTRIN) 200 MG tablet Take 400 mg by mouth every 8 (eight) hours as needed for moderate pain. For pain.    [provider]  lisinopril  (PRINIVIL ,ZESTRIL ) 10 MG tablet Take 10 mg by mouth daily.  08/25/14   [provider]  loratadine -pseudoephedrine (CLARITIN -D 24-HOUR) 10-240 MG per 24 hr tablet Take 1 tablet by mouth daily as needed for allergies. For allergies.    [provider]  metFORMIN (GLUCOPHAGE) 1000 MG tablet Take 1,000 mg by mouth 2 (two) times daily with a meal.  08/25/14 08/25/15  [provider]  montelukast (SINGULAIR) 10 MG tablet Take 10 mg by mouth at bedtime.    [provider]  predniSONE  (DELTASONE ) 10 MG tablet Take 2 tablets (20 mg total) by mouth daily. 04/14/24   Levander Houston, MD    Allergies: Penicillins    Review of Systems  Respiratory:  Positive for shortness of breath.     Updated Vital Signs BP (!) 185/99   Pulse (!) 118   Temp 98.1 F (36.7 C) (Oral)   Resp 16   Ht 5' 2 (1.575 m)   Wt 102.1 kg   LMP 12/26/2012   SpO2 100%   BMI 41.15 kg/m   Physical Exam  (all labs ordered are listed, but only abnormal results are displayed) Labs Reviewed  BASIC METABOLIC PANEL WITH GFR - Abnormal; Notable for the following components:      Result Value   Glucose, Bld 208 (*)    Calcium 8.2 (*)    All other components within normal limits  CBC  BRAIN NATRIURETIC PEPTIDE  D-DIMER, QUANTITATIVE  TROPONIN I (HIGH SENSITIVITY)    EKG: None  Radiology: DG Chest 2 View Result Date: 06/16/2024 CLINICAL DATA:  Shortness of breath EXAM: CHEST - 2 VIEW COMPARISON:  Radiograph and CT 04/14/2024 FINDINGS: Cardiomegaly. Pulmonary vascular congestion. Right basilar airspace opacities. No pleural effusion or pneumothorax. IMPRESSION: Right basilar airspace opacities may be due to atelectasis or pneumonia. Cardiomegaly and pulmonary vascular congestion. Electronically Signed   By: Norman Gatlin M.D.   On: 06/16/2024 23:05    {  Document cardiac monitor, telemetry assessment procedure when appropriate:32947} Procedures   Medications Ordered in the ED - No data to display    {Click here for ABCD2, HEART and other calculators REFRESH Note before signing:1}                              Medical Decision Making Amount and/or Complexity of Data Reviewed Labs: ordered. Radiology: ordered.   ***  {Document critical care time when appropriate  Document review of labs and clinical decision tools ie CHADS2VASC2, etc  Document your independent review of  radiology images and any outside records  Document your discussion with family members, caretakers and with consultants  Document social determinants of health affecting pt's care  Document your decision making why or why not admission, treatments were needed:32947:::1}   Final diagnoses:  None    ED Discharge Orders     None

## 2024-06-17 ENCOUNTER — Telehealth: Payer: Self-pay | Admitting: Physician Assistant

## 2024-06-17 ENCOUNTER — Emergency Department (HOSPITAL_COMMUNITY)

## 2024-06-17 ENCOUNTER — Encounter (HOSPITAL_COMMUNITY): Payer: Self-pay | Admitting: Internal Medicine

## 2024-06-17 ENCOUNTER — Observation Stay (HOSPITAL_COMMUNITY)

## 2024-06-17 DIAGNOSIS — Z794 Long term (current) use of insulin: Secondary | ICD-10-CM

## 2024-06-17 DIAGNOSIS — I5031 Acute diastolic (congestive) heart failure: Secondary | ICD-10-CM | POA: Diagnosis not present

## 2024-06-17 DIAGNOSIS — E782 Mixed hyperlipidemia: Secondary | ICD-10-CM

## 2024-06-17 DIAGNOSIS — J4521 Mild intermittent asthma with (acute) exacerbation: Secondary | ICD-10-CM

## 2024-06-17 DIAGNOSIS — E66812 Obesity, class 2: Secondary | ICD-10-CM

## 2024-06-17 DIAGNOSIS — I10 Essential (primary) hypertension: Secondary | ICD-10-CM | POA: Insufficient documentation

## 2024-06-17 DIAGNOSIS — I5033 Acute on chronic diastolic (congestive) heart failure: Secondary | ICD-10-CM | POA: Diagnosis present

## 2024-06-17 DIAGNOSIS — J810 Acute pulmonary edema: Secondary | ICD-10-CM

## 2024-06-17 DIAGNOSIS — E1169 Type 2 diabetes mellitus with other specified complication: Secondary | ICD-10-CM

## 2024-06-17 DIAGNOSIS — I169 Hypertensive crisis, unspecified: Secondary | ICD-10-CM

## 2024-06-17 LAB — CBC WITH DIFFERENTIAL/PLATELET
Abs Immature Granulocytes: 0.02 K/uL (ref 0.00–0.07)
Basophils Absolute: 0.1 K/uL (ref 0.0–0.1)
Basophils Relative: 1 %
Eosinophils Absolute: 0.3 K/uL (ref 0.0–0.5)
Eosinophils Relative: 3 %
HCT: 47.2 % — ABNORMAL HIGH (ref 36.0–46.0)
Hemoglobin: 15.3 g/dL — ABNORMAL HIGH (ref 12.0–15.0)
Immature Granulocytes: 0 %
Lymphocytes Relative: 18 %
Lymphs Abs: 1.6 K/uL (ref 0.7–4.0)
MCH: 28.7 pg (ref 26.0–34.0)
MCHC: 32.4 g/dL (ref 30.0–36.0)
MCV: 88.6 fL (ref 80.0–100.0)
Monocytes Absolute: 0.5 K/uL (ref 0.1–1.0)
Monocytes Relative: 6 %
Neutro Abs: 6.5 K/uL (ref 1.7–7.7)
Neutrophils Relative %: 72 %
Platelets: 383 K/uL (ref 150–400)
RBC: 5.33 MIL/uL — ABNORMAL HIGH (ref 3.87–5.11)
RDW: 14.2 % (ref 11.5–15.5)
WBC: 9 K/uL (ref 4.0–10.5)
nRBC: 0 % (ref 0.0–0.2)

## 2024-06-17 LAB — BRAIN NATRIURETIC PEPTIDE
B Natriuretic Peptide: 495.7 pg/mL — ABNORMAL HIGH (ref 0.0–100.0)
B Natriuretic Peptide: 505.6 pg/mL — ABNORMAL HIGH (ref 0.0–100.0)

## 2024-06-17 LAB — COMPREHENSIVE METABOLIC PANEL WITH GFR
ALT: 19 U/L (ref 0–44)
AST: 26 U/L (ref 15–41)
Albumin: 3.7 g/dL (ref 3.5–5.0)
Alkaline Phosphatase: 119 U/L (ref 38–126)
Anion gap: 15 (ref 5–15)
BUN: 12 mg/dL (ref 8–23)
CO2: 24 mmol/L (ref 22–32)
Calcium: 8.9 mg/dL (ref 8.9–10.3)
Chloride: 103 mmol/L (ref 98–111)
Creatinine, Ser: 0.94 mg/dL (ref 0.44–1.00)
GFR, Estimated: >60 mL/min (ref >60)
Glucose, Bld: 195 mg/dL — ABNORMAL HIGH (ref 70–99)
Potassium: 4.3 mmol/L (ref 3.5–5.1)
Sodium: 142 mmol/L (ref 135–145)
Total Bilirubin: 1.3 mg/dL — ABNORMAL HIGH (ref 0.0–1.2)
Total Protein: 7.7 g/dL (ref 6.5–8.1)

## 2024-06-17 LAB — MAGNESIUM: Magnesium: 1.7 mg/dL (ref 1.7–2.4)

## 2024-06-17 LAB — GLUCOSE, CAPILLARY
Glucose-Capillary: 198 mg/dL — ABNORMAL HIGH (ref 70–99)
Glucose-Capillary: 161 mg/dL — ABNORMAL HIGH (ref 70–99)
Glucose-Capillary: 192 mg/dL — ABNORMAL HIGH (ref 70–99)
Glucose-Capillary: 163 mg/dL — ABNORMAL HIGH (ref 70–99)

## 2024-06-17 LAB — RESP PANEL BY RT-PCR (RSV, FLU A&B, COVID)  RVPGX2
Influenza A by PCR: NEGATIVE
Influenza B by PCR: NEGATIVE
Resp Syncytial Virus by PCR: NEGATIVE
SARS Coronavirus 2 by RT PCR: NEGATIVE

## 2024-06-17 LAB — ECHOCARDIOGRAM COMPLETE
Area-P 1/2: 9.25 cm2
Calc EF: 47.8 %
Height: 62 in
S' Lateral: 4.27 cm
Single Plane A2C EF: 53.7 %
Single Plane A4C EF: 37 %
Weight: 3398.4 [oz_av]

## 2024-06-17 LAB — TROPONIN I (HIGH SENSITIVITY): Troponin I (High Sensitivity): 10 ng/L (ref <18)

## 2024-06-17 LAB — D-DIMER, QUANTITATIVE: D-Dimer, Quant: 1.24 ug{FEU}/mL — ABNORMAL HIGH (ref 0.00–0.50)

## 2024-06-17 LAB — PROCALCITONIN: Procalcitonin: <0.1 ng/mL

## 2024-06-17 LAB — HEMOGLOBIN A1C
Hgb A1c MFr Bld: 10.4 % — ABNORMAL HIGH (ref 4.8–5.6)
Mean Plasma Glucose: 252 mg/dL

## 2024-06-17 MED ORDER — LORATADINE 10 MG PO TABS
10.0000 mg | ORAL_TABLET | Freq: Every day | ORAL | Status: DC
  Administered 2024-06-17 – 2024-06-20 (×5): 10 mg via ORAL
  Filled 2024-06-17 (×4): qty 1

## 2024-06-17 MED ORDER — FUROSEMIDE 10 MG/ML IJ SOLN
40.0000 mg | Freq: Once | INTRAMUSCULAR | Status: AC
  Administered 2024-06-17: 40 mg via INTRAVENOUS
  Filled 2024-06-17: qty 4

## 2024-06-17 MED ORDER — ALBUTEROL SULFATE (2.5 MG/3ML) 0.083% IN NEBU: 2.5000 mg | INHALATION_SOLUTION | RESPIRATORY_TRACT | Status: DC | PRN

## 2024-06-17 MED ORDER — ACETAMINOPHEN 325 MG PO TABS: 650.0000 mg | ORAL_TABLET | Freq: Four times a day (QID) | ORAL | Status: DC | PRN

## 2024-06-17 MED ORDER — POTASSIUM CHLORIDE CRYS ER 20 MEQ PO TBCR
40.0000 meq | EXTENDED_RELEASE_TABLET | Freq: Once | ORAL | Status: AC
  Administered 2024-06-17: 40 meq via ORAL
  Filled 2024-06-17: qty 2

## 2024-06-17 MED ORDER — INSULIN ASPART 100 UNIT/ML IJ SOLN
0.0000 [IU] | Freq: Three times a day (TID) | INTRAMUSCULAR | Status: DC
  Administered 2024-06-17 (×2): 2 [IU] via SUBCUTANEOUS
  Administered 2024-06-18 (×3): 1 [IU] via SUBCUTANEOUS
  Administered 2024-06-19: 2 [IU] via SUBCUTANEOUS
  Administered 2024-06-19: 1 [IU] via SUBCUTANEOUS
  Administered 2024-06-20 (×2): 3 [IU] via SUBCUTANEOUS

## 2024-06-17 MED ORDER — FUROSEMIDE 10 MG/ML IJ SOLN
40.0000 mg | Freq: Two times a day (BID) | INTRAMUSCULAR | Status: DC
  Administered 2024-06-17 – 2024-06-19 (×5): 40 mg via INTRAVENOUS
  Filled 2024-06-17 (×6): qty 4

## 2024-06-17 MED ORDER — HYDRALAZINE HCL 20 MG/ML IJ SOLN
10.0000 mg | INTRAMUSCULAR | Status: DC | PRN
  Administered 2024-06-17: 10 mg via INTRAVENOUS
  Filled 2024-06-17: qty 1

## 2024-06-17 MED ORDER — GLIPIZIDE ER 10 MG PO TB24
10.0000 mg | ORAL_TABLET | Freq: Every day | ORAL | Status: DC
  Administered 2024-06-18: 10 mg via ORAL
  Filled 2024-06-17: qty 1

## 2024-06-17 MED ORDER — LEVOFLOXACIN IN D5W 750 MG/150ML IV SOLN: 750.0000 mg | Freq: Once | INTRAVENOUS | Status: DC

## 2024-06-17 MED ORDER — IOHEXOL 350 MG/ML SOLN
75.0000 mL | Freq: Once | INTRAVENOUS | Status: AC | PRN
  Administered 2024-06-17: 75 mL via INTRAVENOUS

## 2024-06-17 MED ORDER — LISINOPRIL 20 MG PO TABS
40.0000 mg | ORAL_TABLET | Freq: Every day | ORAL | Status: DC
  Administered 2024-06-17 – 2024-06-20 (×5): 40 mg via ORAL
  Filled 2024-06-17 (×4): qty 2

## 2024-06-17 MED ORDER — LISINOPRIL 20 MG PO TABS: 20.0000 mg | ORAL_TABLET | Freq: Every day | ORAL | Status: DC

## 2024-06-17 MED ORDER — DOXYCYCLINE HYCLATE 100 MG PO TABS
100.0000 mg | ORAL_TABLET | Freq: Once | ORAL | Status: AC
  Administered 2024-06-17: 100 mg via ORAL
  Filled 2024-06-17: qty 1

## 2024-06-17 MED ORDER — METOPROLOL SUCCINATE ER 25 MG PO TB24
25.0000 mg | ORAL_TABLET | Freq: Every day | ORAL | Status: DC
  Administered 2024-06-17 – 2024-06-18 (×2): 25 mg via ORAL
  Filled 2024-06-17 (×2): qty 1

## 2024-06-17 MED ORDER — ALBUTEROL SULFATE (2.5 MG/3ML) 0.083% IN NEBU: 3.0000 mL | INHALATION_SOLUTION | RESPIRATORY_TRACT | Status: DC | PRN

## 2024-06-17 MED ORDER — ENOXAPARIN SODIUM 40 MG/0.4ML IJ SOSY
40.0000 mg | PREFILLED_SYRINGE | INTRAMUSCULAR | Status: DC
  Administered 2024-06-17 – 2024-06-19 (×3): 40 mg via SUBCUTANEOUS
  Filled 2024-06-17 (×3): qty 0.4

## 2024-06-17 MED ORDER — SODIUM CHLORIDE 0.9% FLUSH
3.0000 mL | Freq: Two times a day (BID) | INTRAVENOUS | Status: DC
  Administered 2024-06-17 – 2024-06-20 (×7): 3 mL via INTRAVENOUS

## 2024-06-17 MED ORDER — ACETAMINOPHEN 650 MG RE SUPP
650.0000 mg | Freq: Four times a day (QID) | RECTAL | Status: DC | PRN
Start: 2024-06-17 — End: 2024-06-20

## 2024-06-17 MED ORDER — FLUTICASONE FUROATE-VILANTEROL 100-25 MCG/ACT IN AEPB
1.0000 | INHALATION_SPRAY | Freq: Every day | RESPIRATORY_TRACT | Status: DC
  Administered 2024-06-18 – 2024-06-20 (×4): 1 via RESPIRATORY_TRACT
  Filled 2024-06-17: qty 28

## 2024-06-17 MED ORDER — MELATONIN 3 MG PO TABS
3.0000 mg | ORAL_TABLET | Freq: Every evening | ORAL | Status: DC | PRN
  Filled 2024-06-17: qty 1

## 2024-06-17 MED ORDER — ONDANSETRON HCL 4 MG/2ML IJ SOLN: 4.0000 mg | Freq: Four times a day (QID) | INTRAMUSCULAR | Status: DC | PRN

## 2024-06-17 NOTE — Telephone Encounter (Signed)
 Patient scheduled.

## 2024-06-17 NOTE — Consult Note (Addendum)
 Cardiology Consultation   Patient ID: [name] MRN: [id number]; DOB: 25-Sep-1961  Admit date: 06/16/2024 Date of Consult: 06/17/2024  PCP:  Pura Lenis, MD   [facility] Providers Cardiologist: New to Dr. Michele   CC: Shortness of breath Reason of Consult: Evaluation for CHF Requesting provider: Alto Isaiah CROME, NP  Patient Profile: Ann Holt is a 63 y.o. female with a hx of type 2 diabetes, asthma, hypertension, obesity,  who is being seen 06/17/2024 for the evaluation of CHF at the request of Alto Isaiah NP.  History of Present Illness: Ann Holt with above PMH who presented to the ER early morning today complaining chest tightness, wheezing, inability to take a deep breath.  She had mentioned her asthma symptoms typically gets worse during summer or winter time. She gets intermittently wheezing and short of breath when her asthma flares up.  She had noted her symptoms would improve with rescue inhaler.  Over the past 2 days she has been increasingly wheezing and shortness of breath.  Last night around 9 PM after using her inhaler she experienced chest tightness (which was new) but shortly resolved. She became nervous and called 911.  She states she is feeling much improved currently with resolved symptoms.  She denied progressive chronic shortness of breath with exertion, felt symptom only occurs when her asthma is flaring up.  She denied any leg edema, orthopnea, PND.  She reports losing 100 pound weight over the past 4 years.  She states her diabetes is controlled fairly well.  She takes lisinopril  for hypertension, states occasionally she may miss a pill, does not check her blood pressure regularly at home.  She denied ever having any chest pain, syncope, or history of MI/CAD/CHF/arrhythmia.  She states her mother had suffered a stroke and heart issue at the age of 12.  She denied tobacco use, alcohol use, illicit drug use.   Per ER workup last night, BMP with glucose  208, otherwise unremarkable.  BNP 495.  High sensitive troponin 10 x 1.  CBC unremarkable.  D-dimer elevated 1.24.  Respiratory viral panel negative.  CTA chest showed no pulmonary embolism, pulmonary edema with small right and trace left pleural effusion, right middle lobe consolidation versus atelectasis, persistent bilateral hilar and mediastinal lymphadenopathy of unclear etiology.  In the ER her SBP were as high as 192/128mmHg. She was admitted to hospital medicine service, concerned for new onset heart failure of unknown EF.  She was started on IV Lasix  40 mg twice daily.  Cardiology is consulted today for further evaluation.  Echocardiogram is pending.  She has no prior cardiac history.  She takes lisinopril  for hypertension, atorvastatin for hyperlipidemia, and has type 2 diabetes.  No previous cardiac workup found on her chart.   Past Medical History:  Diagnosis Date   Asthma    Diabetes mellitus without complication (HCC)    Hypertension     Past Surgical History:  Procedure Laterality Date   ANKLE ARTHROPLASTY  Left   KNEE ARTHROSCOPY  Bilateral     Home Medications:  Prior to Admission medications   Medication Sig Start Date End Date Taking? Authorizing Provider  albuterol  (PROVENTIL  HFA;VENTOLIN  HFA) 108 (90 BASE) MCG/ACT inhaler Inhale 2 puffs into the lungs every 4 (four) hours as needed for wheezing or shortness of breath. For shortness of breath.   Yes [provider]  Dulaglutide 1.5 MG/0.5ML SOAJ Inject 1.5 mg into the skin once a week. Inject 1.5mg  subcutaneously weekly on Sunday. 02/18/23  Yes [provider]  glipiZIDE  (GLUCOTROL  XL) 10 MG 24 hr tablet Take 10 mg by mouth. 02/18/23  Yes [provider]  ibuprofen (ADVIL,MOTRIN) 200 MG tablet Take 400 mg by mouth every 8 (eight) hours as needed for moderate pain. For pain.   Yes [provider]  lisinopril  (ZESTRIL ) 20 MG tablet Take 20 mg by mouth. 02/18/23  Yes [provider]   loratadine  (CLARITIN ) 10 MG tablet Take 10 mg by mouth daily.   Yes [provider]  traMADol (ULTRAM) 50 MG tablet Take 50 mg by mouth daily as needed for moderate pain (pain score 4-6). 06/05/20  Yes [provider]    Scheduled Meds:  enoxaparin  (LOVENOX ) injection  40 mg Subcutaneous Q24H   fluticasone  furoate-vilanterol  1 puff Inhalation Daily   furosemide   40 mg Intravenous BID   [START ON 06/18/2024] glipiZIDE   10 mg Oral Q breakfast   insulin  aspart  0-9 Units Subcutaneous TID WC   lisinopril   20 mg Oral Daily   loratadine   10 mg Oral Daily   sodium chloride  flush  3 mL Intravenous Q12H   Continuous Infusions:  PRN Meds: acetaminophen  **OR** acetaminophen , albuterol , hydrALAZINE , melatonin, ondansetron  (ZOFRAN ) IV  Allergies:    Allergies  Allergen Reactions   Penicillins Hives    Social History:   Social History   Socioeconomic History   Marital status: Single    Spouse name: Not on file   Number of children: Not on file   Years of education: Not on file   Highest education level: Not on file  Occupational History   Not on file  Tobacco Use   Smoking status: Never   Smokeless tobacco: Not on file  Vaping Use   Vaping status: Never Used  Substance and Sexual Activity   Alcohol use: No   Drug use: No   Sexual activity: Not Currently    Birth control/protection: None  Other Topics Concern   Not on file  Social History Narrative   Not on file   Social Drivers of Health   Financial Resource Strain: Low Risk  (07/06/2023)   Received from [facility]   Overall Financial Resource Strain (CARDIA)    Difficulty of Paying Living Expenses: Not hard at all  Food Insecurity: No Food Insecurity (06/17/2024)   Hunger Vital Sign    Worried About Running Out of Food in the Last Year: Never true    Ran Out of Food in the Last Year: Never true  Transportation Needs: No Transportation Needs (06/17/2024)   PRAPARE - Scientist, research (physical sciences) (Medical): No    Lack of Transportation (Non-Medical): No  Physical Activity: Insufficiently Active (07/06/2023)   Received from [facility]   Exercise Vital Sign    On average, how many days per week do you engage in moderate to strenuous exercise (like a brisk walk)?: 2 days    On average, how many minutes do you engage in exercise at this level?: 20 min  Stress: No Stress Concern Present (07/06/2023)   Received from [facility] - Occupational Stress Questionnaire    Feeling of Stress : Not at all  Social Connections: Socially Integrated (07/06/2023)   Received from [facility]   Social Network    How would you rate your social network (family, work, friends)?: Good participation with social networks  Intimate Partner Violence: Not At Risk (06/17/2024)   Humiliation, Afraid, Rape, and Kick  questionnaire    Fear of Current or Ex-Partner: No    Emotionally Abused: No    Physically Abused: No    Sexually Abused: No    Family History:   Mother: History of stroke and heart issue  ROS:  Review of Systems  Cardiovascular:  Positive for dyspnea on exertion. Negative for chest pain, claudication, irregular heartbeat, leg swelling, near-syncope, orthopnea, palpitations, paroxysmal nocturnal dyspnea and syncope.  Respiratory:  Positive for shortness of breath and wheezing.   Hematologic/Lymphatic: Negative for bleeding problem.   Physical Exam/Data: Vitals:   06/17/24 0500 06/17/24 0600 06/17/24 0731 06/17/24 1118  BP:  (!) 105/49 138/85 (!) 170/84  Pulse:   (!) 118 95  Resp: 17 (!) 21 16 19   Temp:   97.6 F (36.4 C) 97.7 F (36.5 C)  TempSrc:   Oral Oral  SpO2:   93% 94%  Weight:      Height:        Intake/Output Summary (Last 24 hours) at 06/17/2024 1203 Last data filed at 06/17/2024 1116 Gross per 24 hour  Intake --  Output 2750 ml  Net -2750 ml      06/17/2024    4:11 AM 06/16/2024   10:11 PM 11/02/2023    3:00 PM   Last 3 Weights  Weight (lbs) 212 lb 6.4 oz 225 lb 210 lb  Weight (kg) 96.344 kg 102.059 kg 95.255 kg     Body mass index is 38.85 kg/m.   Vitals:  Vitals:   06/17/24 0731 06/17/24 1118  BP: 138/85 (!) 170/84  Pulse: (!) 118 95  Resp: 16 19  Temp: 97.6 F (36.4 C) 97.7 F (36.5 C)  SpO2: 93% 94%   General Appearance: In no apparent distress, laying flat comfortably in bed, morbidly obese HEENT: Normocephalic, atraumatic.  Neck: Supple, trachea midline, no JVDs (Short and thick neck limiting exam) Cardiovascular: Regular rate and rhythm, normal S1-S2, no murmur Respiratory: Resting breathing unlabored, lungs sounds clear to auscultation bilaterally, no use of accessory muscles. On room air.  No wheezes, rales or rhonchi.   Gastrointestinal: Bowel sounds positive, abdominal soft and nontender Extremities: Able to move all extremities in bed without difficulty, no edema bilateral lower extremity  Musculoskeletal: Normal muscle bulk and tone Skin: Warm, dry, generalized discoloration noted bilateral upper extremity Neurologic: Alert, oriented to person, place and time. Fluent speech, no cognitive deficit, no gross focal neuro deficit Psychiatric: Normal affect. Mood is appropriate.    EKG:  The EKG was personally reviewed and demonstrates:    EKG from 06/16/2024 2223 showed sinus tachycardia 117 bpm, nonspecific T wave flattening of lateral leads, poor R wave progression, no significant acute change.  Telemetry:  Telemetry was personally reviewed and demonstrates:    Sinus tachycardia up to 120-130  Relevant CV Studies:  Echocardiogram is pending  Laboratory Data: High Sensitivity Troponin:   Recent Labs  Lab 06/16/24 2347  TROPONINIHS 10     Chemistry Recent Labs  Lab 06/16/24 2232  NA 140  K 3.6  CL 105  CO2 26  GLUCOSE 208*  BUN 13  CREATININE 0.75  CALCIUM 8.2*  GFRNONAA >60  ANIONGAP 9    No results for input(s): PROT, ALBUMIN, AST, ALT,  ALKPHOS, BILITOT in the last 168 hours. Lipids No results for input(s): CHOL, TRIG, HDL, LABVLDL, LDLCALC, CHOLHDL in the last 168 hours.  Hematology Recent Labs  Lab 06/16/24 2232  WBC 7.4  RBC 5.02  HGB 14.6  HCT 45.6  MCV 90.8  MCH 29.1  MCHC 32.0  RDW 14.2  PLT 322   Thyroid No results for input(s): TSH, FREET4 in the last 168 hours.  BNP Recent Labs  Lab 06/16/24 2347  BNP 495.7*    DDimer  Recent Labs  Lab 06/16/24 2347  DDIMER 1.24*    Radiology/Studies:  CT Angio Chest PE W/Cm &/Or Wo Cm Result Date: 06/17/2024 CLINICAL DATA:  Pulmonary embolism (PE) suspected, high prob OB x2 days. Hx of asthma, HTN and DBM. Pt used inhaler x2 tonight with some relief. Per EMS lungs clear, EMS vitals 95% RA, 180/110 BP has not taken meds, 250 CBG. PT states ran out of nebulizer solution EXAM: CT ANGIOGRAPHY CHEST WITH CONTRAST TECHNIQUE: Multidetector CT imaging of the chest was performed using the standard protocol during bolus administration of intravenous contrast. Multiplanar CT image reconstructions and MIPs were obtained to evaluate the vascular anatomy. RADIATION DOSE REDUCTION: This exam was performed according to the departmental dose-optimization program which includes automated exposure control, adjustment of the mA and/or kV according to patient size and/or use of iterative reconstruction technique. CONTRAST:  75mL OMNIPAQUE  IOHEXOL  350 MG/ML SOLN COMPARISON:  CT angio chest 04/14/2024 FINDINGS: Cardiovascular: Satisfactory opacification of the pulmonary arteries to the segmental level. No evidence of pulmonary embolism. Prominent heart size. No significant pericardial effusion. The thoracic aorta is normal in caliber. No atherosclerotic plaque of the thoracic aorta. No coronary artery calcifications. Mediastinum/Nodes: Persistent mediastinal lymphadenopathy with as an example a subcarinal 1 cm lymph node and a pre-vascular 1.4 cm lymph node. Persistent  bilateral hilar lymphadenopathy with a 1.5 cm right lymph node. No axillary lymphadenopathy. Thyroid gland, trachea, and esophagus demonstrate no significant findings. Lungs/Pleura: Mild interlobular septal wall thickening. Right middle lobe opacity (11:13). No pulmonary nodule. No pulmonary mass. Small right pleural effusion. Trace left pleural effusion. No pneumothorax. Upper Abdomen: No acute abnormality. Musculoskeletal: No chest wall abnormality. No suspicious lytic or blastic osseous lesions. No acute displaced fracture. Multilevel degenerative changes of the spine. Review of the MIP images confirms the above findings. IMPRESSION: 1. No pulmonary embolus. 2. Pulmonary edema with small right and trace left pleural effusion. 3. Right middle lobe consolidation versus atelectasis. 4. Persistent bilateral hilar and mediastinal lymphadenopathy. Unclear etiology. Additional imaging evaluation or consultation with Pulmonology recommended. Electronically Signed   By: Morgane  Naveau M.D.   On: 06/17/2024 01:55   DG Chest 2 View Result Date: 06/16/2024 CLINICAL DATA:  Shortness of breath EXAM: CHEST - 2 VIEW COMPARISON:  Radiograph and CT 04/14/2024 FINDINGS: Cardiomegaly. Pulmonary vascular congestion. Right basilar airspace opacities. No pleural effusion or pneumothorax. IMPRESSION: Right basilar airspace opacities may be due to atelectasis or pneumonia. Cardiomegaly and pulmonary vascular congestion. Electronically Signed   By: Norman Gatlin M.D.   On: 06/16/2024 23:05     Assessment and Plan:  Hypertensive urgency -Blood pressure was up to 192/125, in the setting of respiratory distress -CTA chest revealed evidence of pulmonary edema, bilateral pleural effusion, right middle lobe consolidation versus atelectasis - SBP 170s currently, takes lisinopril  20 mg daily at home however occasionally skips a pill, does not track blood pressure at home - Will increase lisinopril  to 40 mg daily, continue to trend  BP  Acute heart failure with unknown EF versus flash pulmonary edema - She denied any historical heart failure decompensation symptoms, presented with 2 days of increased shortness of breath and wheezing and chest tightness likely due to asthma exacerbation, noted to be in hypertensive urgency last night  - CTA  chest as mentioned above, BNP 495 - Suspect flash pulmonary edema - Echocardiogram is pending, will follow and make further recommendation - Status post IV Lasix  40 mg twice daily, net -2.7 L, suspect she wont need prolonged diuresis, will continue for now - Please check daily weight, intake output, daily renal index/electrolytes  Sinus tachycardia -Likely albuterol  driven  ? Pneumonia  Asthma Morbid obesity Type 2 diabetes -Per primary team  Risk Assessment/Risk Scores:   [place]  Heart Association (NYHA) Functional Class NYHA Class I/II     For questions or updates, please contact [facility] Please consult [email or web address] for contact info under    Signed, Xika Zhao, NP  06/17/2024  ADDENDUM:   Patient seen and examined with Xika Zhao NP.  I personally taken a history, examined the patient, reviewed relevant notes,  laboratory data / imaging studies.  I performed a substantive portion of this encounter and formulated the important aspects of the plan.  I agree with the APP's note, impression, and recommendations; however, I have edited the note to reflect changes or salient points.   Patient seen and examined at bedside. Patient states that she was having episodes of wheezing for which she uses her rescue inhalers and symptoms improved. However, later yesterday night she continued to have worsening wheezing and shortness of breath and difficulty in breathing.  Patient states that her anxiety level started to increase and she noticed some discomfort in her chest which she described as tightness.  Because the tightness was not experienced in the past, on the  left and came to the ED for further evaluation.  Patient was noted to have hypertensive crisis with systolic blood pressure greater 190 mmHg and significantly short of breath.  D-dimers were elevated CT chest PE protocol was formed and negative for pulmonary embolism but pulmonary edema was noted.  Cardiology consulted for CHF evaluation.  Patient at baseline does not endorse orthopnea, PND, or lower extremity swelling. Patient states that the fact she is lost weight for the last couple years. Overall functional capacity is limited as she walks with a cane.  At her current functional capacity she denies anginal chest pain. In the ER patient stated that his pressure improved chest tightness have resolved. Has multiple cardiovascular risk factors including but not insulin -dependent diabetes,, hyperlipidemia, obesity, postmenopausal female.  PHYSICAL EXAM: Today's Vitals   06/17/24 0600 06/17/24 0731 06/17/24 1118 06/17/24 1558  BP: (!) 105/49 138/85 (!) 170/84 (!) 188/106  Pulse:  (!) 118 95 99  Resp: (!) 21 16 19 15   Temp:  97.6 F (36.4 C) 97.7 F (36.5 C) 97.9 F (36.6 C)  TempSrc:  Oral Oral Oral  SpO2:  93% 94% 96%  Weight:      Height:      PainSc:  0-No pain     Body mass index is 38.85 kg/m.   Net IO Since Admission: -3,250 mL [06/17/24 1705]  Filed Weights   06/16/24 2211 06/17/24 0411  Weight: 102.1 kg 96.3 kg    General: Age-appropriate, hemodynamically stable, no acute distress, not on nasal cannula oxygen HEENT: Normocephalic, atraumatic, trachea midline, no JVD. Lungs: Clear to auscultation bilaterally, no wheezes rales or rhonchi's Heart: Regular rate and rhythm, positive S1-S2, no murmur or gallops appreciated Abdomen: Obese, soft, nontender, nondistended, positive bowel sounds in all 4 quadrants Extremities: No lower extremity swelling, warm to touch Skin: Suggestive of vitiligo  EKG: (personally reviewed by me) 06/16/2024: Sinus tachycardia, 117 bpm, without  underlying ischemia or injury  pattern  Telemetry: (personally reviewed by me) Sinus tachycardia  Echo 080/06/2024  1. Diffuse hypokinesis, worse in the the basal inferior, inferolateral  walls. Left ventricular ejection fraction, by estimation, is 40 to 45%.  The left ventricle has mildly decreased function. There is mild concentric  left ventricular hypertrophy. Left  ventricular diastolic parameters are consistent with Grade I diastolic  dysfunction (impaired relaxation). Elevated left atrial pressure.   2. Right ventricular systolic function is moderately reduced. The right  ventricular size is normal.   3. Right atrial size was moderately dilated.   4. Mild mitral valve regurgitation.   5. The aortic valve is tricuspid. Aortic valve regurgitation is not  visualized. Aortic valve sclerosis/calcification is present, without any  evidence of aortic stenosis.    Impression & Recommendations: :  Hypertensive crisis with pulmonary edema. Heart failure with mildly reduced LVEF, newly discovered. Hypertension Insulin -dependent diabetes mellitus type 2. Hyperlipidemia Obesity due to excess calories  Recurrent hospitalization was precipitated by worsening shortness of breath consistent with asthma exacerbation which led to increased anxiety and possible worsening shortness of breath prompting her to call 911.  Patient does not endorse any anginal chest pain.  She did have chest tightness when she was in acute respiratory distress this is likely secondary to supply/demand ischemia due to hypoxia and elevated blood pressures.  EKG is nonischemic, high sensitive troponin x 1, and no symptoms to suggest exertional chest pain.  As part of the workup he was evaluated for congestive heart failure an echocardiogram was performed which notes mildly reduced LVEF likely precipitated by multiple cardiovascular risk factors specifically hypertensive heart disease.  She had a CT PE study which was  negative for pulmonary embolism and the report mentions no coronary calcification (which is favorable)  I cannot entirely rule out a degree of heart failure but I suspect that there is a possibility that once her blood pressures are better controlled and risk factors are optimized a repeat limited echocardiogram can be performed to reevaluate LVEF.  In the interim would recommend uptitration of GDMT.  This current hospitalization is likely because of uncontrolled hypertension causing pulmonary edema.  Agree with increasing lisinopril  to 40 mg p.o. daily. Added low-dose metoprolol  with holding parameters. Currently on IV Lasix . Will uptitrate GDMT with close to discharge. A1c is pending. Recommend checking fasting lipids and starting lipid-lowering agents given her ASCVD risk as well as diabetes (defer to primary team).  No need for inpatient ischemic evaluation as she is essentially asymptomatic.  However, once the acute problem list have been resolved outpatient coronary CTA could be considered for further evaluation/risk stratification.  Especially if follow-up echocardiogram notes reduced or low normal LVEF.  Patient agreeable with the plan of care.  Medical Decision:  Complexity: High Interdisciplinary: Yes Independently reviewed: ER documentation, CT PE protocol study results, labs 06/17/2024, echocardiogram, EKG, telemetry Prescription drug management -recommendations stated above  Further recommendations to follow as the case evolves.   This note was created using a voice recognition software as a result there may be grammatical errors inadvertently enclosed that do not reflect the nature of this encounter. Every attempt is made to correct such errors.   Madonna Michele HAS, [facility]  A Division of [facility] [address], [place], [place] [address]  [place], [place] [address] Pager: [phone] Office: [phone] 06/17/2024 5:05 PM

## 2024-06-17 NOTE — Progress Notes (Signed)
  Carryover admission to the Day Admitter.  I discussed this case with the EDP, Norleen Essex, PA.  Per these discussions:   This is a 63 year old female with history of type 2 diabetes mellitus, mild intermittent asthma, who is being admitted with suspected new diagnosis of acute CHF presenting with 2 to 3 days of new onset shortness of breath associated with mild nonproductive cough, without wheezing or chest pain.  She has no known history of underlying heart failure.  In the setting of his recent shortness of breath, patient reports that she has been frequently using her rescue albuterol  inhaler over the last 2 days, but noted no significant improvement in her shortness of breath with this measure.  In the ED, patient was found to be afebrile, and is maintaining oxygen saturation of 100% on room air.  Troponin was nonelevated at 10.  Magnesium level is pending.  BNP in this patient with BMI of 41, was noted to be 500.  Her D-dimer was elevated, prompting CTA chest with PE protocol, which showed no evidence of acute pulmonary embolism but demonstrated evidence of pulmonary edema as well as trace to small bilateral pleural effusions in addition to right middle lobe airspace opacity, concerning for pneumonia versus atelectasis.  In the ED, she received a dose of Lasix  40 mg IV.  She also received a dose of doxycycline  for concern regarding potential community-acquired pneumonia in the setting of right middle lobe airspace opacity.  She is noted to have a reported allergy to penicillin.  I have placed an order for observation med/tele for further evaluation management of the above.  I have placed some additional preliminary admit orders via the adult multi-morbid admission order set. I have also ordered echocardiogram and repeat BNP in the morning.  I have ordered additional IV Lasix  in the form of Lasix  40 mg IV twice daily, in addition to incentive spirometry.  Never ordered morning labs in form of  CMP and CBC.  Regarding potential community-acquired pneumonia, I have added on a procalcitonin level, and will defer additional decision making regarding antibiotics to the admitting hospitalist.  Will also ordered as needed albuterol  inhaler.  For her mildly elevated blood pressure, I have also ordered as needed IV hydralazine .    Eva Pore, DO Hospitalist

## 2024-06-17 NOTE — Progress Notes (Signed)
 Heart Failure Nurse Navigator Progress Note  PCP: Pura Lenis, MD PCP-Cardiologist: None Admission Diagnosis: Shortness of breath.  Admitted from: Home via EMS   Presentation:   Ann Holt presented with shortness of breath x 2 days, used inhaler x2 with some relief. A nonproductive cough, no chest pain or swelling. BP 180/110, BNP 495.7, BMI 41.15, D-Dimer 1.24, CXR with cardiomegaly, bilateral hilar vascular congestion, with bilateral pleural effusions, more right than left. CT chest with bilateral ground glass opacities, with interlobular septal thickening. Bilateral pleural effusions, more right than left. No PE. Plan for Cardiac Cath 8/11.   Patient was educated on the sign and symptoms of heart failure, daily weights, when to call her doctor or go to the ED. Diet/ fluid restrictions, taking all medications as prescribed and attending all medical appointments. Patient verbalized her understanding of all education. A HF TOC appointment was scheduled for 06/24/2024 @ 10:30 am.   ECHO/ LVEF: 40-45%   Clinical Course:  Past Medical History:  Diagnosis Date   Asthma    Diabetes mellitus without complication (HCC)    Hypertension      Social History   Socioeconomic History   Marital status: Single    Spouse name: Not on file   Number of children: Not on file   Years of education: Not on file   Highest education level: Not on file  Occupational History   Not on file  Tobacco Use   Smoking status: Never   Smokeless tobacco: Not on file  Vaping Use   Vaping status: Never Used  Substance and Sexual Activity   Alcohol use: No   Drug use: No   Sexual activity: Not Currently    Birth control/protection: None  Other Topics Concern   Not on file  Social History Narrative   Not on file   Social Drivers of Health   Financial Resource Strain: Low Risk  (07/06/2023)   Received from [facility]   Overall Financial Resource Strain (CARDIA)    Difficulty of Paying Living  Expenses: Not hard at all  Food Insecurity: No Food Insecurity (06/17/2024)   Hunger Vital Sign    Worried About Running Out of Food in the Last Year: Never true    Ran Out of Food in the Last Year: Never true  Transportation Needs: No Transportation Needs (06/17/2024)   PRAPARE - Administrator, Civil Service (Medical): No    Lack of Transportation (Non-Medical): No  Physical Activity: Insufficiently Active (07/06/2023)   Received from [facility]   Exercise Vital Sign    On average, how many days per week do you engage in moderate to strenuous exercise (like a brisk walk)?: 2 days    On average, how many minutes do you engage in exercise at this level?: 20 min  Stress: No Stress Concern Present (07/06/2023)   Received from [facility] - Occupational Stress Questionnaire    Feeling of Stress : Not at all  Social Connections: Socially Integrated (07/06/2023)   Received from [facility]   Social Network    How would you rate your social network (family, work, friends)?: Good participation with social networks   Education Assessment and Provision:  Detailed education and instructions provided on heart failure disease management including the following:  Signs and symptoms of Heart Failure When to call the physician Importance of daily weights Low sodium diet Fluid restriction Medication management Anticipated future follow-up appointments  Patient education given  on each of the above topics.  Patient acknowledges understanding via teach back method and acceptance of all instructions.  Education Materials:  Living Better With Heart Failure Booklet, HF zone tool, & Daily Weight Tracker Tool.  Patient has scale at home: Yes Patient has pill box at home: Yes    High Risk Criteria for Readmission and/or Poor Patient Outcomes: Heart failure hospital admissions (last 6 months): 0  No Show rate: 0 Difficult social situation: No   Demonstrates medication adherence: Yes Primary Language: English  Literacy level: Reading, writing, and comprehension.   Barriers of Care:   Diet/ fluid restrictions Daily weights.   Considerations/Referrals:   Referral made to Heart Failure Pharmacist Stewardship: No  Referral made to Heart Failure CSW/NCM TOC: NA Referral made to Heart & Vascular TOC clinic: Yes, 06/24/2024 @ 10:30 am.   Items for Follow-up on DC/TOC: Continued HF education  Diet/ fluid restrictions/ daily weights    Stephane Haddock, BSN, RN Heart Failure Teacher, adult education Only

## 2024-06-17 NOTE — TOC Initial Note (Signed)
 Transition of Care [facility]) - Initial/Assessment Note    Patient Details  Name: Ann Holt MRN: [id number] Date of Birth: Mar 16, 1961  Transition of Care [facility]) CM/SW Contact:    Andrez JULIANNA George, RN Phone Number: 06/17/2024, 3:34 PM  Clinical Narrative:                  Pt is from home alone. She states her daughter lives close by.  She manages her own medications and denies any issues.  Pt drives self but states her daughter could assist if needed.  IP Care Management following.  Expected Discharge Plan: Home/Self Care Barriers to Discharge: Continued Medical Work up   Patient Goals and CMS Choice            Expected Discharge Plan and Services       Living arrangements for the past 2 months: Apartment                                      Prior Living Arrangements/Services Living arrangements for the past 2 months: Apartment Lives with:: Self Patient language and need for interpreter reviewed:: Yes Do you feel safe going back to the place where you live?: Yes          Current home services: DME (quad cane/ shower seat) Criminal Activity/Legal Involvement Pertinent to Current Situation/Hospitalization: No - Comment as needed  Activities of Daily Living   ADL Screening (condition at time of admission) Independently performs ADLs?: Yes (appropriate for developmental age) Is the patient deaf or have difficulty hearing?: No Does the patient have difficulty seeing, even when wearing glasses/contacts?: No Does the patient have difficulty concentrating, remembering, or making decisions?: No  Permission Sought/Granted                  Emotional Assessment Appearance:: Appears stated age Attitude/Demeanor/Rapport: Engaged Affect (typically observed): Accepting Orientation: : Oriented to Self, Oriented to Place, Oriented to  Time, Oriented to Situation   Psych Involvement: No (comment)  Admission diagnosis:  Acute diastolic heart failure (HCC)  [P49.68] SOB (shortness of breath) [R06.02] Patient Active Problem List   Diagnosis Date Noted   Acute diastolic heart failure (HCC) 06/17/2024   PCP:  Pura Lenis, MD Pharmacy:   [facility] [phone] [address] [place], [place] - [address] AT [facility] [address] [facility] [address] [place] [address] Phone: [phone] Fax: [phone]     Social Drivers of Health (SDOH) Social History: SDOH Screenings   Food Insecurity: No Food Insecurity (06/17/2024)  Housing: Unknown (06/17/2024)  Transportation Needs: No Transportation Needs (06/17/2024)  Utilities: Not At Risk (06/17/2024)  Financial Resource Strain: Low Risk  (06/17/2024)  Physical Activity: Insufficiently Active (07/06/2023)   [facility]  Social Connections: Socially Integrated (07/06/2023)   Received from [facility]  Stress: No Stress Concern Present (07/06/2023)   Received from [facility]  Tobacco Use: Unknown (06/16/2024)   SDOH Interventions: Housing Interventions: Intervention Not Indicated Financial Strain Interventions: Intervention Not Indicated   Readmission Risk Interventions     No data to display

## 2024-06-17 NOTE — Plan of Care (Signed)
  Problem: Education: Goal: Ability to describe self-care measures that may prevent or decrease complications (Diabetes Survival Skills Education) will improve Outcome: Progressing   Problem: Coping: Goal: Ability to adjust to condition or change in health will improve Outcome: Progressing

## 2024-06-17 NOTE — H&P (Addendum)
 History and Physical    Patient: Ann Holt FMW:[id number] DOB: 1961/11/02 DOA: 06/16/2024 DOS: the patient was seen and examined on 06/17/2024 PCP: Pura Lenis, MD   Patient coming from: Home  Chief Complaint:  Chief Complaint  Patient presents with   Shortness of Breath   HPI: Ann Holt is a 63 y.o. female with medical history significant of asthma, hypertension and diabetes mellitus.  Patient reports waxing and waning symptoms for about 4 to 5 weeks that she describes as typical for her asthma with primary symptom of wheezing.  She used her inhalers and would get brief relief from her symptoms.  Of note she is not on any long-acting bronchodilators.  She has noted her asthmatic symptoms have worsened this summer during the heat.  Yesterday in addition to wheezing she noticed tightness in her chest and inability to take in a deep breath and this is what prompted her to seek care in the emergency department.  In the ED she was afebrile, hypertensive with an initial BP reading of 185/99 with additional readings as high as 176/100.  Room air sats were 100% at rest.  Labs revealed an elevated BNP of 495.7 with normal troponin.  D-dimer was elevated at 1.24.  Chest x-ray for right basilar airspace opacities that could either be atelectasis or pneumonia.  Because of the elevated D-dimer CTA of the chest was completed that demonstrated no evidence of PE but did reveal pulmonary edema with small right and trace left pleural effusion.  There is right middle lobe consolidation versus atelectasis.  There was also a finding of persistent bilateral hilar and mediastinal lymphadenopathy with recommendation for pulmonary medicine follow-up.  Because of the concerning findings for new onset heart failure she was given a total of 80 mg of IV Lasix  in divided dosages.  And as of time of dictation she has voided 1750 cc.  Hospital service was consulted to evaluate the patient for admission.   Review of  Systems: In addition to above patient reports 100 pound weight loss over the past several years.  She denied any lower extremity swelling, orthopnea or dyspnea on exertion.  She does report some improvement in her symptoms after she was given Lasix  in the ED. no constitutional symptoms such as fever, chills, productive cough.   Past Medical History:  Diagnosis Date   Asthma    Diabetes mellitus without complication (HCC)    Hypertension    Past Surgical History:  Procedure Laterality Date   ANKLE ARTHROPLASTY  Left   KNEE ARTHROSCOPY  Bilateral   Social History:  reports that she has never smoked. She does not have any smokeless tobacco history on file. She reports that she does not drink alcohol and does not use drugs.  Allergies  Allergen Reactions   Penicillins Hives    History reviewed. No pertinent family history.  Prior to Admission medications   Medication Sig Start Date End Date Taking? Authorizing Provider  albuterol  (PROVENTIL  HFA;VENTOLIN  HFA) 108 (90 BASE) MCG/ACT inhaler Inhale 2 puffs into the lungs every 4 (four) hours as needed for wheezing or shortness of breath. For shortness of breath.   Yes [provider]  Dulaglutide 1.5 MG/0.5ML SOAJ Inject 1.5 mg into the skin once a week. Inject 1.5mg  subcutaneously weekly on Sunday. 02/18/23  Yes [provider]  glipiZIDE  (GLUCOTROL  XL) 10 MG 24 hr tablet Take 10 mg by mouth. 02/18/23  Yes [provider]  ibuprofen (ADVIL,MOTRIN) 200 MG tablet Take 400 mg by mouth  every 8 (eight) hours as needed for moderate pain. For pain.   Yes [provider]  lisinopril  (ZESTRIL ) 20 MG tablet Take 20 mg by mouth. 02/18/23  Yes [provider]  loratadine  (CLARITIN ) 10 MG tablet Take 10 mg by mouth daily.   Yes [provider]  traMADol (ULTRAM) 50 MG tablet Take 50 mg by mouth daily as needed for moderate pain (pain score 4-6). 06/05/20  Yes [provider]    Physical  Exam: Vitals:   06/17/24 0144 06/17/24 0300 06/17/24 0400 06/17/24 0411  BP: (!) 179/98 (!) 176/100  (!) 192/125  Pulse: 93 87 89 (!) 107  Resp: 15 19 14 20   Temp: 97.7 F (36.5 C)   (!) 97.5 F (36.4 C)  TempSrc: Oral   Oral  SpO2: 100% 99% 100% 95%  Weight:    96.3 kg  Height:    5' 2 (1.575 m)   Constitutional: NAD, calm, comfortable Neck: normal, supple, no masses, no thyromegaly or JVD Respiratory: Posterior exam of basilar crackles. Normal respiratory effort. No accessory muscle use.  Room air Cardiovascular: Regular rate and rhythm, no murmurs / rubs / gallops. No extremity edema. 2+ pedal pulses. No carotid bruits.  Abdomen: no tenderness, no masses palpated. No hepatosplenomegaly. Bowel sounds positive.  Musculoskeletal: no clubbing / cyanosis. No joint deformity upper and lower extremities. Good ROM, no contractures. Normal muscle tone.  Skin: no rashes, lesions, ulcers. No induration Neurologic: CN 2-12 grossly intact. Sensation intact, Strength 5/5 x all 4 extremities.  Psychiatric: Normal judgment and insight. Alert and oriented x 3. Normal mood.    Data Reviewed:  Sodium 140, potassium 3.6, chloride 105, CO2 26, glucose 208, BUN 13, creatinine 0.75 calcium 8.2, GFR greater than 60  BNP 495.7, troponin 10  WBC 7400 differential not obtained, hemoglobin 14.6, platelets 322,000  PCR for influenza, RSV and COVID-negative  Imaging as above  Assessment and Plan: Respiratory failure without hypoxia secondary to presumed new onset heart failure Patient reports a history of what sounds like her typical asthmatic symptoms for multiple weeks worsening with tightness in chest Presented to ED without hypoxia but marked hypertension and imaging suspicious for volume overload/heart failure and elevated BNP Obtain echocardiogram Symptoms have improved after the administration of Lasix  Unclear if progressive and worsened symptoms secondary to asthma led to marked increase in  BP which led to potential flash pulmonary edema-echo will help with differentiating this Intake and output and daily weight Control blood pressure Consult cardiology as well as heart failure navigator  Suspected asthma exacerbation with possible evolving pneumonia Patient reports typical asthmatic exacerbation symptoms not relieved with short acting bronchodilator as per usual Was not on long-acting bronchodilator so we will start Breo Ellipta  this admission Currently not significantly wheezing and moving air well so will not initiate steroids especially in context of diabetes and current hyperglycemia No constitutional symptoms so do not suspect respiratory symptoms truly related to pneumonia.  Procalcitonin pending.  For now we will hold off on antibiotics  Hypertension BP markedly uncontrolled at time of presentation but after Lasix  has improved Continue home lisinopril  Adjust antihypertensive medications based on echocardiogram findings  Diabetes mellitus 2 Continue Glucotrol  Follow CBGs and provide SSI and or meal coverage Hold weekly dulaglutide for now  Persistent bilateral hilar and mediastinal lymphadenopathy Radiologist recommends pulmonary input and pulmonary has been consulted Patient is non-smoker     Advance Care Planning:   Code Status: Full Code   VTE prophylaxis: Lovenox   Consults:  Cardiology, pulmonary medicine  Family Communication: Patient only  Severity of Illness: The appropriate patient status for this patient is OBSERVATION. Observation status is judged to be reasonable and necessary in order to provide the required intensity of service to ensure the patient's safety. The patient's presenting symptoms, physical exam findings, and initial radiographic and laboratory data in the context of their medical condition is felt to place them at decreased risk for further clinical deterioration. Furthermore, it is anticipated that the patient will be medically  stable for discharge from the hospital within 2 midnights of admission.   Author: Isaiah Lever, NP 06/17/2024 6:11 AM  For on call review [email or web address].

## 2024-06-17 NOTE — Care Management Obs Status (Signed)
 MEDICARE OBSERVATION STATUS NOTIFICATION   Patient Details  Name: Ann Holt MRN: [id number] Date of Birth: [birth year]   Medicare Observation Status Notification Given:  Yes    Andrez JULIANNA George, RN 06/17/2024, 3:07 PM

## 2024-06-18 DIAGNOSIS — I5033 Acute on chronic diastolic (congestive) heart failure: Secondary | ICD-10-CM | POA: Diagnosis present

## 2024-06-18 DIAGNOSIS — E876 Hypokalemia: Secondary | ICD-10-CM

## 2024-06-18 DIAGNOSIS — Z7984 Long term (current) use of oral hypoglycemic drugs: Secondary | ICD-10-CM | POA: Diagnosis not present

## 2024-06-18 DIAGNOSIS — R590 Localized enlarged lymph nodes: Secondary | ICD-10-CM | POA: Diagnosis present

## 2024-06-18 DIAGNOSIS — E785 Hyperlipidemia, unspecified: Secondary | ICD-10-CM

## 2024-06-18 DIAGNOSIS — I5021 Acute systolic (congestive) heart failure: Secondary | ICD-10-CM | POA: Diagnosis not present

## 2024-06-18 DIAGNOSIS — E1169 Type 2 diabetes mellitus with other specified complication: Secondary | ICD-10-CM | POA: Diagnosis not present

## 2024-06-18 DIAGNOSIS — Z880 Allergy status to penicillin: Secondary | ICD-10-CM | POA: Diagnosis not present

## 2024-06-18 DIAGNOSIS — Z6841 Body Mass Index (BMI) 40.0 and over, adult: Secondary | ICD-10-CM | POA: Diagnosis not present

## 2024-06-18 DIAGNOSIS — Z7985 Long-term (current) use of injectable non-insulin antidiabetic drugs: Secondary | ICD-10-CM | POA: Diagnosis not present

## 2024-06-18 DIAGNOSIS — I10 Essential (primary) hypertension: Secondary | ICD-10-CM | POA: Diagnosis not present

## 2024-06-18 DIAGNOSIS — Z23 Encounter for immunization: Secondary | ICD-10-CM | POA: Diagnosis not present

## 2024-06-18 DIAGNOSIS — E66812 Obesity, class 2: Secondary | ICD-10-CM

## 2024-06-18 DIAGNOSIS — I161 Hypertensive emergency: Secondary | ICD-10-CM

## 2024-06-18 DIAGNOSIS — I110 Hypertensive heart disease with heart failure: Secondary | ICD-10-CM | POA: Diagnosis present

## 2024-06-18 DIAGNOSIS — Z1152 Encounter for screening for COVID-19: Secondary | ICD-10-CM | POA: Diagnosis not present

## 2024-06-18 DIAGNOSIS — Z79899 Other long term (current) drug therapy: Secondary | ICD-10-CM | POA: Diagnosis not present

## 2024-06-18 DIAGNOSIS — I5041 Acute combined systolic (congestive) and diastolic (congestive) heart failure: Secondary | ICD-10-CM | POA: Diagnosis not present

## 2024-06-18 DIAGNOSIS — J45909 Unspecified asthma, uncomplicated: Secondary | ICD-10-CM

## 2024-06-18 DIAGNOSIS — J810 Acute pulmonary edema: Secondary | ICD-10-CM | POA: Diagnosis not present

## 2024-06-18 DIAGNOSIS — E1165 Type 2 diabetes mellitus with hyperglycemia: Secondary | ICD-10-CM | POA: Diagnosis present

## 2024-06-18 DIAGNOSIS — Z794 Long term (current) use of insulin: Secondary | ICD-10-CM | POA: Diagnosis not present

## 2024-06-18 DIAGNOSIS — Z7952 Long term (current) use of systemic steroids: Secondary | ICD-10-CM | POA: Diagnosis not present

## 2024-06-18 DIAGNOSIS — I5031 Acute diastolic (congestive) heart failure: Secondary | ICD-10-CM | POA: Diagnosis present

## 2024-06-18 DIAGNOSIS — Z823 Family history of stroke: Secondary | ICD-10-CM | POA: Diagnosis not present

## 2024-06-18 DIAGNOSIS — I509 Heart failure, unspecified: Secondary | ICD-10-CM | POA: Insufficient documentation

## 2024-06-18 LAB — GLUCOSE, CAPILLARY
Glucose-Capillary: 139 mg/dL — ABNORMAL HIGH (ref 70–99)
Glucose-Capillary: 142 mg/dL — ABNORMAL HIGH (ref 70–99)
Glucose-Capillary: 135 mg/dL — ABNORMAL HIGH (ref 70–99)
Glucose-Capillary: 142 mg/dL — ABNORMAL HIGH (ref 70–99)
Glucose-Capillary: 132 mg/dL — ABNORMAL HIGH (ref 70–99)

## 2024-06-18 LAB — BASIC METABOLIC PANEL WITH GFR
Anion gap: 10 (ref 5–15)
BUN: 14 mg/dL (ref 8–23)
CO2: 30 mmol/L (ref 22–32)
Calcium: 8.5 mg/dL — ABNORMAL LOW (ref 8.9–10.3)
Chloride: 102 mmol/L (ref 98–111)
Creatinine, Ser: 0.93 mg/dL (ref 0.44–1.00)
GFR, Estimated: >60 mL/min (ref >60)
Glucose, Bld: 141 mg/dL — ABNORMAL HIGH (ref 70–99)
Potassium: 3.3 mmol/L — ABNORMAL LOW (ref 3.5–5.1)
Sodium: 142 mmol/L (ref 135–145)

## 2024-06-18 MED ORDER — PNEUMOCOCCAL 20-VAL CONJ VACC 0.5 ML IM SUSY: 0.5000 mL | PREFILLED_SYRINGE | INTRAMUSCULAR | Status: DC | PRN

## 2024-06-18 MED ORDER — POTASSIUM CHLORIDE CRYS ER 20 MEQ PO TBCR
40.0000 meq | EXTENDED_RELEASE_TABLET | ORAL | Status: AC
  Administered 2024-06-18 (×2): 40 meq via ORAL
  Filled 2024-06-18 (×2): qty 2

## 2024-06-18 MED ORDER — CARVEDILOL 3.125 MG PO TABS
3.1250 mg | ORAL_TABLET | Freq: Two times a day (BID) | ORAL | Status: DC
  Administered 2024-06-18 – 2024-06-20 (×8): 3.125 mg via ORAL
  Filled 2024-06-18 (×6): qty 1

## 2024-06-18 NOTE — Progress Notes (Signed)
 Progress Note  Patient Name: Ann Holt Date of Encounter: 06/18/2024  Primary Cardiologist: None  Subjective   No symptoms.  Diuresed well.  Inpatient Medications    Scheduled Meds:  carvedilol   3.125 mg Oral BID WC   enoxaparin  (LOVENOX ) injection  40 mg Subcutaneous Q24H   fluticasone  furoate-vilanterol  1 puff Inhalation Daily   furosemide   40 mg Intravenous BID   glipiZIDE   10 mg Oral Q breakfast   insulin  aspart  0-9 Units Subcutaneous TID WC   lisinopril   40 mg Oral Daily   loratadine   10 mg Oral Daily   sodium chloride  flush  3 mL Intravenous Q12H   Continuous Infusions:  PRN Meds: acetaminophen  **OR** acetaminophen , albuterol , hydrALAZINE , melatonin, ondansetron  (ZOFRAN ) IV   Vital Signs    Vitals:   06/17/24 1746 06/17/24 1922 06/17/24 2344 06/18/24 0354  BP: (!) 163/93 (!) 140/78 (!) 149/89 (!) 152/73  Pulse:  87 86 83  Resp: 16 20 20 20   Temp:  98.2 F (36.8 C) 98.6 F (37 C) 98 F (36.7 C)  TempSrc:  Oral Oral Oral  SpO2:  94% 91% 92%  Weight:    92.7 kg  Height:        Intake/Output Summary (Last 24 hours) at 06/18/2024 1047 Last data filed at 06/18/2024 0357 Gross per 24 hour  Intake 601 ml  Output 2750 ml  Net -2149 ml   Filed Weights   06/16/24 2211 06/17/24 0411 06/18/24 0354  Weight: 102.1 kg 96.3 kg 92.7 kg    Telemetry     Personally reviewed.  NSR.  ECG    Not performed today.  Physical Exam   GEN: No acute distress.   Neck:  JVD mildly elevated. Cardiac: RRR, no murmur, rub, or gallop.  Respiratory: Nonlabored. Clear to auscultation bilaterally. GI: Soft, nontender, bowel sounds present. MS: No edema; No deformity. Neuro:  Nonfocal. Psych: Alert and oriented x 3. Normal affect.  Labs    Chemistry Recent Labs  Lab 06/16/24 2232 06/17/24 1225 06/18/24 0255  NA 140 142 142  K 3.6 4.3 3.3*  CL 105 103 102  CO2 26 24 30   GLUCOSE 208* 195* 141*  BUN 13 12 14   CREATININE 0.75 0.94 0.93  CALCIUM 8.2* 8.9 8.5*   PROT  --  7.7  --   ALBUMIN  --  3.7  --   AST  --  26  --   ALT  --  19  --   ALKPHOS  --  119  --   BILITOT  --  1.3*  --   GFRNONAA >60 >60 >60  ANIONGAP 9 15 10      Hematology Recent Labs  Lab 06/16/24 2232 06/17/24 1225  WBC 7.4 9.0  RBC 5.02 5.33*  HGB 14.6 15.3*  HCT 45.6 47.2*  MCV 90.8 88.6  MCH 29.1 28.7  MCHC 32.0 32.4  RDW 14.2 14.2  PLT 322 383    Cardiac Enzymes Recent Labs  Lab 06/16/24 2347  TROPONINIHS 10    BNP Recent Labs  Lab 06/16/24 2347 06/17/24 1225  BNP 495.7* 505.6*     DDimer Recent Labs  Lab 06/16/24 2347  DDIMER 1.24*     Assessment & Plan   Hypertensive emergency with flash pulmonary edema Acute systolic heart failure, LVEF 40 to 45% - Presented with acute DOE, echocardiogram this admission showed LVEF 40 to 45% with RWMA. - Continue IV Lasix  40 mg twice daily. - Switch metoprolol  to Coreg  3.125  mg twice daily. - Continue lisinopril  40 mg once daily. - She will benefit from inpatient LHC on Monday due to cardiac risk factors including HTN, DM 2 and advanced age.    Signed, Diannah SHAUNNA Maywood, MD  06/18/2024, 10:47 AM

## 2024-06-18 NOTE — Assessment & Plan Note (Addendum)
 Echocardiogram with mildly reduced LV systolic function 40 to 45%, mild concentric LVH, diffuse hypokinesis, worse in the basal inferior and inferolateral walls. RV systolic function with moderate reduction, RA with moderate dilatation mild mitral valve regurgitation, no significant valvular disease.   Patient was placed on furosemide  IV for diuresis, negative fluid balance was achieved, - 6,116 ml, with significant improvement in her symptoms.   08/11 cardiac catheterization  RA 5 RV 42/9 PA 39/15 mean 25  PCWP 8  Cardiac output 4.76 and index 2.46   Compensated heart failure with increased pulmonary pressure, pre capillary.    Continue carvedilol , spironolactone  and will add losartan  with the intention to change to entresto  as outpatient.    Hold on SGLT 2 inh considering her body habitus and risk of urine infections.

## 2024-06-18 NOTE — Hospital Course (Addendum)
 Ann Holt was admitted to the hospital with the working diagnosis of heart failure exacerbation.   63 yo female with the past medical history of asthma, hypertension, obesity and T2DM who presented with dyspnea.  Reported acute worsening of dyspnea for 48 hrs prior to admission, associated with chest tightness and inability to take a deep breath. She called EMS and she was brought to the hospital. On her initial physical examination her blood pressure was 179/98, HR 93 RR 20 and 02 saturation 99% Lungs with bilateral rales with no wheezing, heart with S1 and S2 present and regular, with no gallops or rubs, abdomen with no distention and no lower extremity edema.   Na 142, K 4.3 Cl 103 bicarbonate 24 glucose 195 bun 12 cr 0,94  AST 26 ALT 19  BNP 505  Wbc 9,0 hgb 15.3 plt 383   Sars covid 19 negative  Influenza negative  RSV negative   Chest radiograph with cardiomegaly, bilateral hilar vascular congestion, with bilateral pleural effusions, more right than left. CT chest with bilateral ground glass opacities, with interlobular septal thickening. Bilateral pleural effusions, more right than left.  No pulmonary embolism. Persistent bilateral hilar and mediastinal lymphadenopathy.   EKG 117 bpm, normal axis, normal intervals, qtc 485, sinus rhythm with no significant ST segment or T wave changes.  08/10 volume status is improving, plan for cardiac catheterization tomorrow  08/11 cardiac catheterization with normalized filling pressures. Normal coronaries.  Plan to follow up as outpatient

## 2024-06-18 NOTE — Assessment & Plan Note (Addendum)
 Renal function has remained stable, at discharge her serum cr at 1,0, K is 4,0 and serum bicarbonate at 29, and Mg 2.3 Na 140   Patient will continue diuresis with furosemide  40 mg po bid and spironolactone .  Follow up renal function and electrolytes as outpatient

## 2024-06-18 NOTE — Assessment & Plan Note (Addendum)
 Continue glucose cover and monitoring with insulin  sliding scale Her glucose remained stable during her hospitalization  Resume her glipizide  and dulaglutide at the time of discharge.   Continue with statin

## 2024-06-18 NOTE — Assessment & Plan Note (Signed)
 No signs of acute exacerbation Continue with bronchodilator therapy.

## 2024-06-18 NOTE — Progress Notes (Signed)
 Progress Note   Patient: Ann Holt FMW:[id number] DOB: 10/29/1961 DOA: 06/16/2024     0 DOS: the patient was seen and examined on 06/18/2024   Brief hospital course: Mrs. Bowie was admitted to the hospital with the working diagnosis of heart failure exacerbation.   63 yo female with the past medical history of asthma, hypertension, obesity and T2DM who presented with dyspnea.  Reported acute worsening of dyspnea for 48 hrs prior to admission, associated with chest tightness and inability to take a deep breath. She called EMS and she was brought to the hospital. On her initial physical examination her blood pressure was 179/98, HR 93 RR 20 and 02 saturation 99% Lungs with bilateral rales with no wheezing, heart with S1 and S2 present and regular, with no gallops or rubs, abdomen with no distention and no lower extremity edema.   Na 142, K 4.3 Cl 103 bicarbonate 24 glucose 195 bun 12 cr 0,94  AST 26 ALT 19  BNP 505  Wbc 9,0 hgb 15.3 plt 383   Sars covid 19 negative  Influenza negative  RSV negative   Chest radiograph with cardiomegaly, bilateral hilar vascular congestion, with bilateral pleural effusions, more right than left. CT chest with bilateral ground glass opacities, with interlobular septal thickening. Bilateral pleural effusions, more right than left.  No pulmonary embolism. Persistent bilateral hilar and mediastinal lymphadenopathy.   EKG 117 bpm, normal axis, normal intervals, qtc 485, sinus rhythm with no significant ST segment or T wave changes.   Assessment and Plan: * Acute on chronic diastolic CHF (congestive heart failure) (HCC) Echocardiogram with mildly reduced LV systolic function 40 to 45%, mild concentric LVH, diffuse hypokinesis, worse in the basal inferior and inferolateral walls. RV systolic function with moderate reduction, RA with moderate dilatation mild mitral valve regurgitation, no significant valvular disease.   Urine output is 3,150 ml Systolic blood  pressure 140 mmHg range  Plan to continue diuresis with IV furosemide  40 mg IV bid.  Continue carvedilol  and lisinopril . Possible addition of spironolactone  on this admission.  Question SGLT 2 inh considering her body habitus and risk of urine infections.   Essential hypertension Continue blood pressure control with carvedilol  and lisinopril  .  Hypokalemia Follow up renal function with serum cr at 0,93 with K at 3,3 and serum bicarbonate at 30  Na 142   Plan to add kcl 40 meq x2 and follow up renal function and electrolytes Continue diuresis with furosemide    Type 2 diabetes mellitus with hyperlipidemia (HCC) Continue glucose cover and monitoring with insulin  sliding scale Hold glipizide  for now due to risk of hypoglycemia Fasting glucose today 141 mg/dl   Continue with statin   Asthma, chronic No signs of acute exacerbation Continue with bronchodilator therapy  Obesity, class 2 Calculated BMI is 37.3      Subjective: Patient is feeling better, dyspnea and edema are improving but not yet back to baseline, no chest pain, no PND or orthopnea   Physical Exam: Vitals:   06/17/24 1922 06/17/24 2344 06/18/24 0354 06/18/24 1123  BP: (!) 140/78 (!) 149/89 (!) 152/73 (!) 149/87  Pulse: 87 86 83 87  Resp: 20 20 20  (!) 23  Temp: 98.2 F (36.8 C) 98.6 F (37 C) 98 F (36.7 C) 98 F (36.7 C)  TempSrc: Oral Oral Oral Oral  SpO2: 94% 91% 92% 93%  Weight:   92.7 kg   Height:       Neurology awake and alert ENT with mild pallor with no  icterus Cardiovascular with S1 and S2 present and regular with no gallops, rubs or murmurs Mild JVD Respiratory with mild rales at bases with no wheezing or rhonchi  Abdomen with no distention, soft and non tender Positive lower extremity edema +  Data Reviewed:    Family Communication: no family at the beside   Disposition: Status is: Inpatient Remains inpatient appropriate because: IV diuresis   Planned Discharge Destination:  Home     Author: Elidia Toribio Furnace, MD 06/18/2024 2:03 PM  For on call review [email or web address].

## 2024-06-18 NOTE — Assessment & Plan Note (Signed)
 Calculated BMI is 37.3

## 2024-06-18 NOTE — Assessment & Plan Note (Signed)
Continue blood pressure control with carvedilol. 

## 2024-06-19 DIAGNOSIS — E876 Hypokalemia: Secondary | ICD-10-CM | POA: Diagnosis not present

## 2024-06-19 DIAGNOSIS — E1169 Type 2 diabetes mellitus with other specified complication: Secondary | ICD-10-CM | POA: Diagnosis not present

## 2024-06-19 DIAGNOSIS — I5033 Acute on chronic diastolic (congestive) heart failure: Secondary | ICD-10-CM | POA: Diagnosis not present

## 2024-06-19 DIAGNOSIS — I10 Essential (primary) hypertension: Secondary | ICD-10-CM | POA: Diagnosis not present

## 2024-06-19 LAB — BASIC METABOLIC PANEL WITH GFR
Anion gap: 13 (ref 5–15)
BUN: 22 mg/dL (ref 8–23)
CO2: 29 mmol/L (ref 22–32)
Calcium: 8.5 mg/dL — ABNORMAL LOW (ref 8.9–10.3)
Chloride: 101 mmol/L (ref 98–111)
Creatinine, Ser: 0.95 mg/dL (ref 0.44–1.00)
GFR, Estimated: >60 mL/min (ref >60)
Glucose, Bld: 153 mg/dL — ABNORMAL HIGH (ref 70–99)
Potassium: 4.2 mmol/L (ref 3.5–5.1)
Sodium: 143 mmol/L (ref 135–145)

## 2024-06-19 LAB — GLUCOSE, CAPILLARY
Glucose-Capillary: 117 mg/dL — ABNORMAL HIGH (ref 70–99)
Glucose-Capillary: 127 mg/dL — ABNORMAL HIGH (ref 70–99)
Glucose-Capillary: 174 mg/dL — ABNORMAL HIGH (ref 70–99)
Glucose-Capillary: 158 mg/dL — ABNORMAL HIGH (ref 70–99)

## 2024-06-19 LAB — MAGNESIUM: Magnesium: 1.8 mg/dL (ref 1.7–2.4)

## 2024-06-19 MED ORDER — MAGNESIUM SULFATE 2 GM/50ML IV SOLN
2.0000 g | Freq: Once | INTRAVENOUS | Status: AC
  Administered 2024-06-19: 2 g via INTRAVENOUS
  Filled 2024-06-19: qty 50

## 2024-06-19 MED ORDER — FUROSEMIDE 40 MG PO TABS
40.0000 mg | ORAL_TABLET | Freq: Two times a day (BID) | ORAL | Status: DC
  Administered 2024-06-19 – 2024-06-20 (×5): 40 mg via ORAL
  Filled 2024-06-19 (×3): qty 1

## 2024-06-19 NOTE — Progress Notes (Signed)
 Progress Note  Patient Name: Ann Holt Date of Encounter: 06/19/2024  Primary Cardiologist: None  Subjective   No symptoms.  Diuresed well.  Inpatient Medications    Scheduled Meds:  carvedilol   3.125 mg Oral BID WC   enoxaparin  (LOVENOX ) injection  40 mg Subcutaneous Q24H   fluticasone  furoate-vilanterol  1 puff Inhalation Daily   furosemide   40 mg Intravenous BID   insulin  aspart  0-9 Units Subcutaneous TID WC   lisinopril   40 mg Oral Daily   loratadine   10 mg Oral Daily   sodium chloride  flush  3 mL Intravenous Q12H   Continuous Infusions:  PRN Meds: acetaminophen  **OR** acetaminophen , albuterol , hydrALAZINE , melatonin, ondansetron  (ZOFRAN ) IV, [START ON 06/20/2024] pneumococcal 20-valent conjugate vaccine   Vital Signs    Vitals:   06/19/24 0001 06/19/24 0432 06/19/24 0741 06/19/24 0833  BP: (!) 146/82 (!) 122/91 139/82   Pulse: 85 78 75 78  Resp: 18 19 13 16   Temp: 98.5 F (36.9 C) 98 F (36.7 C) 97.6 F (36.4 C)   TempSrc: Oral Oral Oral   SpO2: 92% 95% 95% 96%  Weight:  92.2 kg    Height:        Intake/Output Summary (Last 24 hours) at 06/19/2024 1040 Last data filed at 06/19/2024 0700 Gross per 24 hour  Intake 843 ml  Output 1900 ml  Net -1057 ml   Filed Weights   06/17/24 0411 06/18/24 0354 06/19/24 0432  Weight: 96.3 kg 92.7 kg 92.2 kg    Telemetry     Personally reviewed.  NSR.  ECG    Not performed today.  Physical Exam   GEN: No acute distress.   Neck:  JVD -ve Cardiac: RRR, no murmur, rub, or gallop.  Respiratory: Nonlabored. Clear to auscultation bilaterally. GI: Soft, nontender, bowel sounds present. MS: No edema; No deformity. Neuro:  Nonfocal. Psych: Alert and oriented x 3. Normal affect.  Labs    Chemistry Recent Labs  Lab 06/17/24 1225 06/18/24 0255 06/19/24 0303  NA 142 142 143  K 4.3 3.3* 4.2  CL 103 102 101  CO2 24 30 29   GLUCOSE 195* 141* 153*  BUN 12 14 22   CREATININE 0.94 0.93 0.95  CALCIUM 8.9  8.5* 8.5*  PROT 7.7  --   --   ALBUMIN 3.7  --   --   AST 26  --   --   ALT 19  --   --   ALKPHOS 119  --   --   BILITOT 1.3*  --   --   GFRNONAA >60 >60 >60  ANIONGAP 15 10 13      Hematology Recent Labs  Lab 06/16/24 2232 06/17/24 1225  WBC 7.4 9.0  RBC 5.02 5.33*  HGB 14.6 15.3*  HCT 45.6 47.2*  MCV 90.8 88.6  MCH 29.1 28.7  MCHC 32.0 32.4  RDW 14.2 14.2  PLT 322 383    Cardiac Enzymes Recent Labs  Lab 06/16/24 2347  TROPONINIHS 10    BNP Recent Labs  Lab 06/16/24 2347 06/17/24 1225  BNP 495.7* 505.6*     DDimer Recent Labs  Lab 06/16/24 2347  DDIMER 1.24*     Assessment & Plan   Hypertensive emergency with flash pulmonary edema Acute systolic heart failure, LVEF 40 to 45% - Presented with acute DOE, echocardiogram this admission showed LVEF 40 to 45% with RWMA. - Compensated today. - Switch IV to p.o. Lasix  40 mg twice daily. - Continue carvedilol  3.125 mg twice  daily. - Continue lisinopril  40 mg once daily. - She will benefit from inpatient LHC/RHC on Monday due to cardiac risk factors including HTN, DM 2 and advanced age. Patient is agreeable to [facility] tomorrow.  Keep n.p.o. after midnight.  Informed consent for LHC/RHC Risks and benefits of cardiac catheterization have been discussed with the patient.  These include bleeding, infection, kidney damage, stroke, heart attack, death.  The patient understands these risks and is willing to proceed.     Signed, Diannah SHAUNNA Maywood, MD  06/19/2024, 10:40 AM

## 2024-06-19 NOTE — Progress Notes (Signed)
 Progress Note   Patient: Ann Holt FMW:[id number] DOB: November 12, 1960 DOA: 06/16/2024     1 DOS: the patient was seen and examined on 06/19/2024   Brief hospital course: Mrs. Ebers was admitted to the hospital with the working diagnosis of heart failure exacerbation.   63 yo female with the past medical history of asthma, hypertension, obesity and T2DM who presented with dyspnea.  Reported acute worsening of dyspnea for 48 hrs prior to admission, associated with chest tightness and inability to take a deep breath. She called EMS and she was brought to the hospital. On her initial physical examination her blood pressure was 179/98, HR 93 RR 20 and 02 saturation 99% Lungs with bilateral rales with no wheezing, heart with S1 and S2 present and regular, with no gallops or rubs, abdomen with no distention and no lower extremity edema.   Na 142, K 4.3 Cl 103 bicarbonate 24 glucose 195 bun 12 cr 0,94  AST 26 ALT 19  BNP 505  Wbc 9,0 hgb 15.3 plt 383   Sars covid 19 negative  Influenza negative  RSV negative   Chest radiograph with cardiomegaly, bilateral hilar vascular congestion, with bilateral pleural effusions, more right than left. CT chest with bilateral ground glass opacities, with interlobular septal thickening. Bilateral pleural effusions, more right than left.  No pulmonary embolism. Persistent bilateral hilar and mediastinal lymphadenopathy.   EKG 117 bpm, normal axis, normal intervals, qtc 485, sinus rhythm with no significant ST segment or T wave changes.  08/10 volume status is improving, plan for cardiac catheterization tomorrow   Assessment and Plan: * Acute on chronic diastolic CHF (congestive heart failure) (HCC) Echocardiogram with mildly reduced LV systolic function 40 to 45%, mild concentric LVH, diffuse hypokinesis, worse in the basal inferior and inferolateral walls. RV systolic function with moderate reduction, RA with moderate dilatation mild mitral valve  regurgitation, no significant valvular disease.   Urine output is 2,200 ml Systolic blood pressure 100 mmHg range  Plan to continue diuresis with IV furosemide  40 mg IV bid.  Continue carvedilol  and lisinopril . Possible addition of spironolactone  on this admission.  Question SGLT 2 inh considering her body habitus and risk of urine infections.   Essential hypertension Continue blood pressure control with carvedilol  and lisinopril  .  Hypokalemia Renal function stable with serum cr at 0,95 with K at 4,2 and serum bicarbonate at 29  Na 143 and Mg 1,8   Add 2 g mag sulfate to prevent hypomagnesemia Continue diuresis with furosemide  and follow up renal function and electrolytes in am.   Type 2 diabetes mellitus with hyperlipidemia (HCC) Continue glucose cover and monitoring with insulin  sliding scale Hold glipizide  for now due to risk of hypoglycemia Fasting glucose today 153 mg/dl   Continue with statin   Asthma, chronic No signs of acute exacerbation Continue with bronchodilator therapy  Obesity, class 2 Calculated BMI is 37.3      Subjective: Patient with improvement in her dyspnea, no chest pain, no PND or orthopnea    Physical Exam: Vitals:   06/19/24 0001 06/19/24 0432 06/19/24 0741 06/19/24 0833  BP: (!) 146/82 (!) 122/91 139/82   Pulse: 85 78 75 78  Resp: 18 19 13 16   Temp: 98.5 F (36.9 C) 98 F (36.7 C) 97.6 F (36.4 C)   TempSrc: Oral Oral Oral   SpO2: 92% 95% 95% 96%  Weight:  92.2 kg    Height:       Neurology awake and alert ENT with mild pallor  with no icterus Cardiovascular with S1 and S2 present and regular with no gallops, rubs or murmurs Respiratory with no rales or wheezing, no rhonchi  Abdomen with no distention, non tender soft to palpation  Trace non pitting lower extremity edema  Data Reviewed:    Family Communication: no family at the bedside   Disposition: Status is: Inpatient Remains inpatient appropriate because: for cardiac  catheterization   Planned Discharge Destination: Home      Author: Elidia Toribio Furnace, MD 06/19/2024 10:05 AM  For on call review [email or web address].

## 2024-06-19 NOTE — Evaluation (Signed)
 Occupational Therapy Evaluation Patient Details Name: Ann Holt MRN: [id number] DOB: 01-21-1961 Today's Date: 06/19/2024   History of Present Illness   Pt is a 63 y.o. female admitted 8/7 for SOB. CT of chest showed evidence of small bil pleural effusions. New dx of CHF. PMH: HTN, DM, asthma     Clinical Impressions Pt admitted based on above, and was seen based on problem list below. PTA pt was independent with ADLs and IADLs Today pt is requiring set up  to CGA for ADLs. Bed mobility was mod I and functional transfers are  CGA with use of RW. Pt limited by decreased balance and activity tolerance. Anticipate pt will progress well, no follow up OT needs. OT will continue to follow acutely to maximize functional independence.     If plan is discharge home, recommend the following:   A little help with walking and/or transfers;A little help with bathing/dressing/bathroom     Functional Status Assessment   Patient has had a recent decline in their functional status and demonstrates the ability to make significant improvements in function in a reasonable and predictable amount of time.     Equipment Recommendations   None recommended by OT      Precautions/Restrictions   Precautions Precautions: Fall Recall of Precautions/Restrictions: Intact Restrictions Weight Bearing Restrictions Per Provider Order: No     Mobility Bed Mobility Overal bed mobility: Modified Independent       General bed mobility comments: HOB elevated    Transfers Overall transfer level: Needs assistance Equipment used: Quad cane, Rolling walker (2 wheels) Transfers: Sit to/from Stand, Bed to chair/wheelchair/BSC Sit to Stand: Contact guard assist     Step pivot transfers: Contact guard assist     General transfer comment: CGA with use of quad cane and rollator (at pt's request). Pt at supervision with use of RW      Balance Overall balance assessment: Mild deficits observed, not  formally tested        ADL either performed or assessed with clinical judgement   ADL Overall ADL's : Needs assistance/impaired Eating/Feeding: Set up;Sitting   Grooming: Contact guard assist;Standing           Upper Body Dressing : Set up;Sitting   Lower Body Dressing: Contact guard assist;Sit to/from stand Lower Body Dressing Details (indicate cue type and reason): Able to figure 4 legs for socks Toilet Transfer: Contact guard assist;Stand-pivot;BSC/3in1 Toilet Transfer Details (indicate cue type and reason): Short distance stand pivot no AD to [facility] Toileting- Clothing Manipulation and Hygiene: Supervision/safety;Sitting/lateral lean       Functional mobility during ADLs: Contact guard assist;Rolling walker (2 wheels) General ADL Comments: Decreased activity tolerance and standing balance     Vision Baseline Vision/History: 0 No visual deficits              Pertinent Vitals/Pain Pain Assessment Pain Assessment: Faces Faces Pain Scale: Hurts even more Pain Location: bil knees Pain Descriptors / Indicators: Aching Pain Intervention(s): Limited activity within patient's tolerance     Extremity/Trunk Assessment Upper Extremity Assessment Upper Extremity Assessment: Generalized weakness   Lower Extremity Assessment Lower Extremity Assessment: Defer to PT evaluation   Cervical / Trunk Assessment Cervical / Trunk Assessment: Normal   Communication Communication Communication: No apparent difficulties   Cognition Arousal: Alert Behavior During Therapy: WFL for tasks assessed/performed Cognition: No apparent impairments       Following commands: Intact       Cueing  General Comments   Cueing Techniques: Verbal cues  VSS on RA           Home Living Family/patient expects to be discharged to:: Private residence Living Arrangements: Alone Available Help at Discharge: Family;Available PRN/intermittently Type of Home: Apartment Home Access: Level  entry     Home Layout: One level     Bathroom Shower/Tub: Chief Strategy Officer: Standard (Toilet riser)     Home Equipment: Shower seat;Grab bars - tub/shower;Cane - quad          Prior Functioning/Environment Prior Level of Function : Independent/Modified Independent;Driving     Mobility Comments: Reports ind, mobility using quad cane ADLs Comments: Reports Ind, daughter recently moved out    OT Problem List: Decreased strength;Decreased activity tolerance;Impaired balance (sitting and/or standing);Decreased safety awareness;Cardiopulmonary status limiting activity   OT Treatment/Interventions: Self-care/ADL training;Therapeutic exercise;Energy conservation;DME and/or AE instruction;Therapeutic activities;Patient/family education;Balance training      OT Goals(Current goals can be found in the care plan section)   Acute Rehab OT Goals Patient Stated Goal: To get better OT Goal Formulation: With patient Time For Goal Achievement: 07/03/24 Potential to Achieve Goals: Good   OT Frequency:  Min 2X/week       AM-PAC OT 6 Clicks Daily Activity     Outcome Measure Help from another person eating meals?: None Help from another person taking care of personal grooming?: A Little Help from another person toileting, which includes using toliet, bedpan, or urinal?: A Little Help from another person bathing (including washing, rinsing, drying)?: A Little Help from another person to put on and taking off regular upper body clothing?: A Little Help from another person to put on and taking off regular lower body clothing?: A Little 6 Click Score: 19   End of Session Equipment Utilized During Treatment: Rolling walker (2 wheels) Nurse Communication: Mobility status  Activity Tolerance: Patient tolerated treatment well Patient left: in bed;with call bell/phone within reach;with family/visitor present  OT Visit Diagnosis: Unsteadiness on feet (R26.81);Other  abnormalities of gait and mobility (R26.89);Muscle weakness (generalized) (M62.81)                Time: 8668-8653 OT Time Calculation (min): 15 min Charges:  OT General Charges $OT Visit: 1 Visit OT Evaluation $OT Eval Low Complexity: 1 Low  Adrianne BROCKS, OT  Acute Rehabilitation Services Office [phone] Secure chat preferred   Adrianne GORMAN Savers 06/19/2024, 4:08 PM

## 2024-06-19 NOTE — Evaluation (Signed)
 Physical Therapy Evaluation Patient Details Name: Ann Holt MRN: [id number] DOB: [birth year] Today's Date: 06/19/2024  History of Present Illness  Pt is a 63 y.o. female admitted 8/7 for SOB. CT of chest showed evidence of small bil pleural effusions. New dx of CHF. PMH: HTN, DM, asthma  Clinical Impression  Pt is presenting below baseline level of functioning at this time. Supervision with quad cane this session, Mod I with RW adjusted to pt appropriate height. Pt would like to work up to utilizing a rollator but currently a RW is most appropriate. Recommend OPPT but pt would like to speak with PCP. Will continue to follow in acute care hospital setting in order to work on strength, balance and mobility in order to decrease risk for falls, injury and re-hospitalization.       If plan is discharge home, recommend the following: Assist for transportation;Assistance with cooking/housework     Equipment Recommendations Rolling walker (2 wheels)     Functional Status Assessment Patient has had a recent decline in their functional status and demonstrates the ability to make significant improvements in function in a reasonable and predictable amount of time.     Precautions / Restrictions Precautions Precautions: Fall Recall of Precautions/Restrictions: Intact Restrictions Weight Bearing Restrictions Per Provider Order: No      Mobility  Bed Mobility Overal bed mobility: Modified Independent       General bed mobility comments: HOB elevated.    Transfers Overall transfer level: Needs assistance Equipment used: Quad cane, Rolling walker (2 wheels) Transfers: Sit to/from Stand Sit to Stand: Supervision, Modified independent (Device/Increase time)           General transfer comment: supervision for safety with quad cane and Mod I with RW. Assisted with appropriate adjustment of RW which significantly improved mobility    Ambulation/Gait Ambulation/Gait assistance:  Supervision, Modified independent (Device/Increase time) Gait Distance (Feet): 40 Feet Assistive device: Rolling walker (2 wheels), Quad cane Gait Pattern/deviations: Step-through pattern, Antalgic, Decreased step length - left, Decreased stance time - right Gait velocity: decreased Gait velocity interpretation: <1.31 ft/sec, indicative of household ambulator   General Gait Details: Pt reports she has knee pain that is currently worse than it has been. She was supervision for 20 ft of gait with quad cane and Mod I with adjust RW     Balance Overall balance assessment: Mild deficits observed, not formally tested, Modified Independent               Pertinent Vitals/Pain Pain Assessment Pain Assessment: Faces Faces Pain Scale: Hurts even more Pain Location: bil knees Pain Descriptors / Indicators: Aching Pain Intervention(s): Monitored during session, Limited activity within patient's tolerance    Home Living Family/patient expects to be discharged to:: Private residence Living Arrangements: Alone Available Help at Discharge: Family;Available PRN/intermittently Type of Home: Apartment Home Access: Level entry       Home Layout: One level Home Equipment: Cane - single point;Shower seat;Grab bars - tub/shower      Prior Function Prior Level of Function : Independent/Modified Independent;Driving             Mobility Comments: Reports ind, mobility using SPC ADLs Comments: Reports Ind, daughter recently moved out     Extremity/Trunk Assessment   Upper Extremity Assessment Upper Extremity Assessment: Defer to OT evaluation    Lower Extremity Assessment Lower Extremity Assessment: Generalized weakness    Cervical / Trunk Assessment Cervical / Trunk Assessment: Normal  Communication   Communication Communication: No apparent difficulties  Cognition Arousal: Alert Behavior During Therapy: WFL for tasks assessed/performed   PT - Cognitive impairments: No  apparent impairments     Following commands: Intact       Cueing Cueing Techniques: Verbal cues            Assessment/Plan    PT Assessment Patient needs continued PT services  PT Problem List Decreased balance;Decreased activity tolerance;Decreased mobility       PT Treatment Interventions DME instruction;Gait training;Therapeutic exercise;Patient/family education;Balance training;Therapeutic activities    PT Goals (Current goals can be found in the Care Plan section)  Acute Rehab PT Goals Patient Stated Goal: To return home and improve mobility PT Goal Formulation: With patient Time For Goal Achievement: 07/03/24 Potential to Achieve Goals: Good    Frequency Min 1X/week        AM-PAC PT 6 Clicks Mobility  Outcome Measure Help needed turning from your back to your side while in a flat bed without using bedrails?: None Help needed moving from lying on your back to sitting on the side of a flat bed without using bedrails?: None Help needed moving to and from a bed to a chair (including a wheelchair)?: None Help needed standing up from a chair using your arms (e.g., wheelchair or bedside chair)?: A Little Help needed to walk in hospital room?: A Little Help needed climbing 3-5 steps with a railing? : A Little 6 Click Score: 21    End of Session Equipment Utilized During Treatment: Gait belt Activity Tolerance: Patient tolerated treatment well Patient left: in bed;with call bell/phone within reach;with family/visitor present Nurse Communication: Mobility status PT Visit Diagnosis: Other abnormalities of gait and mobility (R26.89);Unsteadiness on feet (R26.81);Muscle weakness (generalized) (M62.81);Pain Pain - Right/Left:  (bil) Pain - part of body: Knee    Time: 8585-8573 PT Time Calculation (min) (ACUTE ONLY): 12 min   Charges:   PT Evaluation $PT Eval Low Complexity: 1 Low   PT General Charges $$ ACUTE PT VISIT: 1 Visit       Dorothyann Maier, DPT,  CLT  Acute Rehabilitation Services Office: [phone] (Secure chat preferred)   Dorothyann VEAR Maier 06/19/2024, 2:36 PM

## 2024-06-20 ENCOUNTER — Encounter (HOSPITAL_COMMUNITY)
Admission: EM | Disposition: A | Discharge status: Home / Self Care | Payer: Self-pay | Source: Home / Self Care | Attending: Internal Medicine

## 2024-06-20 ENCOUNTER — Other Ambulatory Visit (HOSPITAL_COMMUNITY): Payer: Self-pay

## 2024-06-20 DIAGNOSIS — E876 Hypokalemia: Secondary | ICD-10-CM | POA: Diagnosis not present

## 2024-06-20 DIAGNOSIS — I5041 Acute combined systolic (congestive) and diastolic (congestive) heart failure: Secondary | ICD-10-CM

## 2024-06-20 DIAGNOSIS — E1169 Type 2 diabetes mellitus with other specified complication: Secondary | ICD-10-CM | POA: Diagnosis not present

## 2024-06-20 DIAGNOSIS — I10 Essential (primary) hypertension: Secondary | ICD-10-CM | POA: Diagnosis not present

## 2024-06-20 DIAGNOSIS — I5033 Acute on chronic diastolic (congestive) heart failure: Secondary | ICD-10-CM | POA: Diagnosis not present

## 2024-06-20 HISTORY — PX: RIGHT/LEFT HEART CATH AND CORONARY ANGIOGRAPHY: CATH118266

## 2024-06-20 LAB — BASIC METABOLIC PANEL WITH GFR
Anion gap: 12 (ref 5–15)
BUN: 24 mg/dL — ABNORMAL HIGH (ref 8–23)
CO2: 29 mmol/L (ref 22–32)
Calcium: 8.6 mg/dL — ABNORMAL LOW (ref 8.9–10.3)
Chloride: 99 mmol/L (ref 98–111)
Creatinine, Ser: 1 mg/dL (ref 0.44–1.00)
GFR, Estimated: >60 mL/min (ref >60)
Glucose, Bld: 148 mg/dL — ABNORMAL HIGH (ref 70–99)
Potassium: 4 mmol/L (ref 3.5–5.1)
Sodium: 140 mmol/L (ref 135–145)

## 2024-06-20 LAB — POCT I-STAT EG7
Acid-Base Excess: 5 mmol/L — ABNORMAL HIGH (ref 0.0–2.0)
Acid-Base Excess: 6 mmol/L — ABNORMAL HIGH (ref 0.0–2.0)
Bicarbonate: 31.4 mmol/L — ABNORMAL HIGH (ref 20.0–28.0)
Bicarbonate: 33.4 mmol/L — ABNORMAL HIGH (ref 20.0–28.0)
Calcium, Ion: 1.14 mmol/L — ABNORMAL LOW (ref 1.15–1.40)
Calcium, Ion: 1.15 mmol/L (ref 1.15–1.40)
HCT: 40 % (ref 36.0–46.0)
HCT: 42 % (ref 36.0–46.0)
Hemoglobin: 13.6 g/dL (ref 12.0–15.0)
Hemoglobin: 14.3 g/dL (ref 12.0–15.0)
O2 Saturation: 67 %
O2 Saturation: 83 %
Potassium: 3.7 mmol/L (ref 3.5–5.1)
Potassium: 3.7 mmol/L (ref 3.5–5.1)
Sodium: 142 mmol/L (ref 135–145)
Sodium: 142 mmol/L (ref 135–145)
TCO2: 33 mmol/L — ABNORMAL HIGH (ref 22–32)
TCO2: 35 mmol/L — ABNORMAL HIGH (ref 22–32)
pCO2, Ven: 50.5 mmHg (ref 44–60)
pCO2, Ven: 61.2 mmHg — ABNORMAL HIGH (ref 44–60)
pH, Ven: 7.345 (ref 7.25–7.43)
pH, Ven: 7.401 (ref 7.25–7.43)
pO2, Ven: 38 mmHg (ref 32–45)
pO2, Ven: 48 mmHg — ABNORMAL HIGH (ref 32–45)

## 2024-06-20 LAB — GLUCOSE, CAPILLARY
Glucose-Capillary: 119 mg/dL — ABNORMAL HIGH (ref 70–99)
Glucose-Capillary: 134 mg/dL — ABNORMAL HIGH (ref 70–99)
Glucose-Capillary: 222 mg/dL — ABNORMAL HIGH (ref 70–99)

## 2024-06-20 LAB — POCT I-STAT 7, (LYTES, BLD GAS, ICA,H+H)
Acid-Base Excess: 5 mmol/L — ABNORMAL HIGH (ref 0.0–2.0)
Bicarbonate: 31.3 mmol/L — ABNORMAL HIGH (ref 20.0–28.0)
Calcium, Ion: 1.17 mmol/L (ref 1.15–1.40)
HCT: 40 % (ref 36.0–46.0)
Hemoglobin: 13.6 g/dL (ref 12.0–15.0)
O2 Saturation: 93 %
Potassium: 3.8 mmol/L (ref 3.5–5.1)
Sodium: 142 mmol/L (ref 135–145)
TCO2: 33 mmol/L — ABNORMAL HIGH (ref 22–32)
pCO2 arterial: 52.5 mmHg — ABNORMAL HIGH (ref 32–48)
pH, Arterial: 7.384 (ref 7.35–7.45)
pO2, Arterial: 71 mmHg — ABNORMAL LOW (ref 83–108)

## 2024-06-20 LAB — MAGNESIUM: Magnesium: 2.3 mg/dL (ref 1.7–2.4)

## 2024-06-20 SURGERY — RIGHT/LEFT HEART CATH AND CORONARY ANGIOGRAPHY
Anesthesia: LOCAL

## 2024-06-20 MED ORDER — HYDRALAZINE HCL 20 MG/ML IJ SOLN: 10.0000 mg | INTRAMUSCULAR | Status: AC | PRN

## 2024-06-20 MED ORDER — SODIUM CHLORIDE 0.9% FLUSH
3.0000 mL | Freq: Two times a day (BID) | INTRAVENOUS | Status: DC
  Administered 2024-06-20 (×2): 3 mL via INTRAVENOUS

## 2024-06-20 MED ORDER — SPIRONOLACTONE 12.5 MG HALF TABLET
12.5000 mg | ORAL_TABLET | Freq: Every day | ORAL | Status: DC
  Administered 2024-06-20 (×2): 12.5 mg via ORAL
  Filled 2024-06-20: qty 1

## 2024-06-20 MED ORDER — FENTANYL CITRATE (PF) 100 MCG/2ML IJ SOLN
INTRAMUSCULAR | Status: DC | PRN
  Administered 2024-06-20 (×2): 25 ug via INTRAVENOUS

## 2024-06-20 MED ORDER — SPIRONOLACTONE 25 MG PO TABS
12.5000 mg | ORAL_TABLET | Freq: Every day | ORAL | 0 refills | Status: DC
  Filled 2024-06-20: qty 15, 30d supply, fill #0

## 2024-06-20 MED ORDER — SODIUM CHLORIDE 0.9 % IV SOLN
250.0000 mL | INTRAVENOUS | Status: DC | PRN
Start: 2024-06-20 — End: 2024-06-20

## 2024-06-20 MED ORDER — LOSARTAN POTASSIUM 25 MG PO TABS
25.0000 mg | ORAL_TABLET | Freq: Every day | ORAL | 0 refills | Status: DC
  Filled 2024-06-20: qty 30, 30d supply, fill #0

## 2024-06-20 MED ORDER — HEPARIN SODIUM (PORCINE) 1000 UNIT/ML IJ SOLN
INTRAMUSCULAR | Status: AC
  Filled 2024-06-20: qty 10

## 2024-06-20 MED ORDER — LANCET DEVICE MISC: 1.0000 | Freq: Three times a day (TID) | 0 refills | Status: AC

## 2024-06-20 MED ORDER — BLOOD GLUCOSE TEST VI STRP: 1.0000 | ORAL_STRIP | Freq: Three times a day (TID) | 0 refills | Status: AC

## 2024-06-20 MED ORDER — IOHEXOL 350 MG/ML SOLN
INTRAVENOUS | Status: DC | PRN
Start: 2024-06-20 — End: 2024-06-20
  Administered 2024-06-20 (×2): 30 mL

## 2024-06-20 MED ORDER — SODIUM CHLORIDE 0.9 % IV SOLN
INTRAVENOUS | Status: AC | PRN
  Administered 2024-06-20 (×2): 10 mL/h via INTRAVENOUS

## 2024-06-20 MED ORDER — CARVEDILOL 3.125 MG PO TABS
3.1250 mg | ORAL_TABLET | Freq: Two times a day (BID) | ORAL | 0 refills | Status: DC
  Filled 2024-06-20: qty 60, 30d supply, fill #0

## 2024-06-20 MED ORDER — LABETALOL HCL 5 MG/ML IV SOLN: 10.0000 mg | INTRAVENOUS | Status: AC | PRN

## 2024-06-20 MED ORDER — LOSARTAN POTASSIUM 25 MG PO TABS: 25.0000 mg | ORAL_TABLET | Freq: Every day | ORAL | Status: DC

## 2024-06-20 MED ORDER — ASPIRIN 81 MG PO CHEW: 81.0000 mg | CHEWABLE_TABLET | ORAL | Status: DC

## 2024-06-20 MED ORDER — SODIUM CHLORIDE 0.9% FLUSH: 3.0000 mL | INTRAVENOUS | Status: DC | PRN

## 2024-06-20 MED ORDER — VERAPAMIL HCL 2.5 MG/ML IV SOLN
INTRAVENOUS | Status: AC
  Filled 2024-06-20: qty 2

## 2024-06-20 MED ORDER — LIDOCAINE HCL (PF) 1 % IJ SOLN
INTRAMUSCULAR | Status: AC
  Filled 2024-06-20: qty 30

## 2024-06-20 MED ORDER — MIDAZOLAM HCL 2 MG/2ML IJ SOLN
INTRAMUSCULAR | Status: DC | PRN
  Administered 2024-06-20 (×2): 1 mg via INTRAVENOUS

## 2024-06-20 MED ORDER — FREE WATER: 500.0000 mL | Freq: Once | Status: DC

## 2024-06-20 MED ORDER — HEPARIN SODIUM (PORCINE) 1000 UNIT/ML IJ SOLN
INTRAMUSCULAR | Status: DC | PRN
Start: 2024-06-20 — End: 2024-06-20
  Administered 2024-06-20 (×2): 5000 [IU] via INTRAVENOUS

## 2024-06-20 MED ORDER — MIDAZOLAM HCL 2 MG/2ML IJ SOLN
INTRAMUSCULAR | Status: AC
  Filled 2024-06-20: qty 2

## 2024-06-20 MED ORDER — FUROSEMIDE 40 MG PO TABS
40.0000 mg | ORAL_TABLET | Freq: Two times a day (BID) | ORAL | 0 refills | Status: DC
  Filled 2024-06-20: qty 60, 30d supply, fill #0

## 2024-06-20 MED ORDER — FENTANYL CITRATE (PF) 100 MCG/2ML IJ SOLN
INTRAMUSCULAR | Status: AC
  Filled 2024-06-20: qty 2

## 2024-06-20 MED ORDER — LIDOCAINE HCL (PF) 1 % IJ SOLN
INTRAMUSCULAR | Status: DC | PRN
  Administered 2024-06-20 (×4): 2 mL via INTRADERMAL

## 2024-06-20 MED ORDER — LANCETS MISC. MISC: 1.0000 | Freq: Three times a day (TID) | 0 refills | Status: AC

## 2024-06-20 MED ORDER — HEPARIN (PORCINE) IN NACL 1000-0.9 UT/500ML-% IV SOLN
INTRAVENOUS | Status: DC | PRN
  Administered 2024-06-20 (×4): 500 mL

## 2024-06-20 MED ORDER — BLOOD GLUCOSE MONITORING SUPPL DEVI: 1.0000 | Freq: Three times a day (TID) | 0 refills | Status: AC

## 2024-06-20 MED ORDER — FREE WATER
250.0000 mL | Freq: Once | Status: AC
  Administered 2024-06-20 (×2): 250 mL via ORAL

## 2024-06-20 MED ORDER — ASPIRIN 81 MG PO CHEW
81.0000 mg | CHEWABLE_TABLET | ORAL | Status: AC
  Administered 2024-06-20 (×2): 81 mg via ORAL
  Filled 2024-06-20: qty 1

## 2024-06-20 MED ORDER — VERAPAMIL HCL 2.5 MG/ML IV SOLN
INTRAVENOUS | Status: DC | PRN
  Administered 2024-06-20 (×2): 10 mL via INTRA_ARTERIAL

## 2024-06-20 SURGICAL SUPPLY — 9 items
CATH 5FR JL3.5 JR4 ANG PIG MP (CATHETERS) IMPLANT
CATH BALLN WEDGE 5F 110CM (CATHETERS) IMPLANT
DEVICE RAD COMP TR BAND LRG (VASCULAR PRODUCTS) IMPLANT
GLIDESHEATH SLEND SS 6F .021 (SHEATH) IMPLANT
GUIDEWIRE INQWIRE 1.5J.035X260 (WIRE) IMPLANT
KIT SINGLE USE MANIFOLD (KITS) IMPLANT
PACK CARDIAC CATHETERIZATION (CUSTOM PROCEDURE TRAY) ×1 IMPLANT
SET ATX-X65L (MISCELLANEOUS) IMPLANT
SHEATH GLIDE SLENDER 4/5FR (SHEATH) IMPLANT

## 2024-06-20 NOTE — Plan of Care (Signed)
  Problem: Education: Goal: Ability to describe self-care measures that may prevent or decrease complications (Diabetes Survival Skills Education) will improve Outcome: Progressing   Problem: Coping: Goal: Ability to adjust to condition or change in health will improve Outcome: Progressing   Problem: Fluid Volume: Goal: Ability to maintain a balanced intake and output will improve Outcome: Progressing   Problem: Health Behavior/Discharge Planning: Goal: Ability to identify and utilize available resources and services will improve Outcome: Progressing Goal: Ability to manage health-related needs will improve Outcome: Progressing   Problem: Nutritional: Goal: Maintenance of adequate nutrition will improve Outcome: Progressing   Problem: Skin Integrity: Goal: Risk for impaired skin integrity will decrease Outcome: Progressing   Problem: Tissue Perfusion: Goal: Adequacy of tissue perfusion will improve Outcome: Progressing   Problem: Education: Goal: Knowledge of General Education information will improve Description: Including pain rating scale, medication(s)/side effects and non-pharmacologic comfort measures Outcome: Progressing   Problem: Health Behavior/Discharge Planning: Goal: Ability to manage health-related needs will improve Outcome: Progressing   Problem: Clinical Measurements: Goal: Ability to maintain clinical measurements within normal limits will improve Outcome: Progressing   Problem: Nutritional: Goal: Progress toward achieving an optimal weight will improve Outcome: Not Progressing

## 2024-06-20 NOTE — Inpatient Diabetes Management (Signed)
 Inpatient Diabetes Program Recommendations  AACE/ADA: New Consensus Statement on Inpatient Glycemic Control (2015)  Target Ranges:  Prepandial:   less than 140 mg/dL      Peak postprandial:   less than 180 mg/dL (1-2 hours)      Critically ill patients:  140 - 180 mg/dL   Lab Results  Component Value Date   GLUCAP 134 (H) 06/20/2024   HGBA1C 10.4 (H) 06/16/2024    Review of Glycemic Control  Latest Reference Range & Units 06/18/24 16:24 06/18/24 21:07 06/19/24 05:55 06/19/24 11:19 06/19/24 16:08 06/19/24 21:07 06/20/24 06:28 06/20/24 11:24  Glucose-Capillary 70 - 99 mg/dL 857 (H) 867 (H) 882 (H) 127 (H) 174 (H) 158 (H) 119 (H) 134 (H)  (H): Data is abnormally high Diabetes history: Type 2 DM Outpatient Diabetes medications: Trulicity 1.5 mg qwk, Glipizide  10 mg QD Current orders for Inpatient glycemic control: Novolog  0-9 units TID  Inpatient Diabetes Program Recommendations:    Spoke with patient regarding outpatient diabetes management. Patient states, I am doing my best to worry about my heart right now, can work on my diabetes next. Reviewed patient's current A1c of 10.4%. Explained what a A1c is and what it measures. Also reviewed goal A1c with patient, importance of good glucose control @ home, and blood sugar goals. Reviewed patho of DM, need for improved control, signs and symptoms of hypos vs hyper glycemia, interventions, CGMs, vascular changes, impact of poor glycemic control and overall heart function, goal setting and other commorbidities.  Patient is in need of a glucose meter when discharged. Is not interested in CGM at this time. However, recommended to increase frequency of CBG checks at home. Interestingly, current trends do not match A1c. Feel this is intake related given patient is NPO for heart cath.  Admits to drinking large amount of juice at home. Reviewed alternatives and how this alone can positively impact blood guars. Encouraged importance of incorporating  protein into diet and plate method. Patient planning to have follow up with PCP next week.  No additional questions at this time.   Thanks, Tinnie Minus, MSN, RNC-OB Diabetes Coordinator [phone] (8a-5p)

## 2024-06-20 NOTE — Progress Notes (Addendum)
  Progress Note  Patient Name: Ann Holt Date of Encounter: 06/20/2024 [facility] Cardiologist: Madonna Large, DO    Interval Summary   Patient reports feeling well this AM. No chest pain. No shortness of breath. No lower extremity swelling. Scheduled for cath today   Vital Signs Vitals:   06/19/24 2009 06/20/24 0031 06/20/24 0446 06/20/24 0718  BP: 137/82 131/65 (!) 144/86 (!) 158/80  Pulse: 87 84 84 80  Resp: 20 20 20 20   Temp: 97.7 F (36.5 C) 98.3 F (36.8 C) 97.9 F (36.6 C) 98 F (36.7 C)  TempSrc: Oral Oral Oral Oral  SpO2: 95% 93% 92% 95%  Weight:   93.3 kg   Height:        Intake/Output Summary (Last 24 hours) at 06/20/2024 0851 Last data filed at 06/20/2024 0834 Gross per 24 hour  Intake 49.21 ml  Output 400 ml  Net -350.79 ml      06/20/2024    4:46 AM 06/19/2024    4:32 AM 06/18/2024    3:54 AM  Last 3 Weights  Weight (lbs) 205 lb 11.2 oz 203 lb 3.2 oz 204 lb 4.8 oz  Weight (kg) 93.305 kg 92.171 kg 92.67 kg      Telemetry/ECG  NSR - Personally Reviewed  Physical Exam  GEN: No acute distress.  Laying flat in the bed. Asleep but arouses easily to voice  Neck: No JVD Cardiac:  RRR, no murmurs, rubs, or gallops.  Respiratory: Clear to auscultation bilaterally. 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 CTA chest showed evidence of pulmonary edema, bilateral pleural effusion. BNP elevated to 495  - Echocardiogram this admission showed EF 40-45%, mild LVH, grade I DD, moderately reduced RV systolic function, mild MR - Patient treated with IV lasix , transitioned to PO lasix  40 mg BID today  - Currently net -5.7 L since admission. Renal function stable with creatinine 1.00  - Pending R/L heart catheterization today  - Continue carvedilol  3.125 mg BID  - Currently on lisinopril  40  mg daily. With reduced EF, would like for her to be on entresto. Received lisinopril  this AM. Stop, plan to start entresto tomorrow evening after 36 hour wash out  - A1c is 10.4 this admission, would like for it to be lower prior to starting SGLT2i  - Start spiro 12.5 mg daily today   Otherwise per primary  - Asthma  - Morbid Obesity  - Type 2 DM    For questions or updates, please contact [facility] Please consult [email or web address] for contact info under       Signed, Rollo FABIENE Louder, PA-C   Personally seen and examined. Agree with above.  Awaiting cardiac catheterization, no further chest pain, EF 40%, p.o. Lasix  40 mg twice daily, creatinine 1.0, right left heart catheterization today, lisinopril  has been stopped to allow for Entresto, spironolactone  12.5 mg started.  Morbid obesity.  Oneil Parchment, MD

## 2024-06-20 NOTE — Progress Notes (Addendum)
  Progress Note  Patient Name: Ann Holt Date of Encounter: 06/20/2024 [facility] Cardiologist: Madonna Large, DO    Interval Summary   Patient reports feeling well this AM. No chest pain. No shortness of breath. No lower extremity swelling. Scheduled for cath today   Vital Signs Vitals:   06/19/24 2009 06/20/24 0031 06/20/24 0446 06/20/24 0718  BP: 137/82 131/65 (!) 144/86 (!) 158/80  Pulse: 87 84 84 80  Resp: 20 20 20 20   Temp: 97.7 F (36.5 C) 98.3 F (36.8 C) 97.9 F (36.6 C) 98 F (36.7 C)  TempSrc: Oral Oral Oral Oral  SpO2: 95% 93% 92% 95%  Weight:   93.3 kg   Height:        Intake/Output Summary (Last 24 hours) at 06/20/2024 0851 Last data filed at 06/20/2024 0834 Gross per 24 hour  Intake 49.21 ml  Output 400 ml  Net -350.79 ml      06/20/2024    4:46 AM 06/19/2024    4:32 AM 06/18/2024    3:54 AM  Last 3 Weights  Weight (lbs) 205 lb 11.2 oz 203 lb 3.2 oz 204 lb 4.8 oz  Weight (kg) 93.305 kg 92.171 kg 92.67 kg      Telemetry/ECG  NSR - Personally Reviewed  Physical Exam  GEN: No acute distress.  Laying flat in the bed. Asleep but arouses easily to voice  Neck: No JVD Cardiac:  RRR, no murmurs, rubs, or gallops.  Respiratory: Clear to auscultation bilaterally. 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 CTA chest showed evidence of pulmonary edema, bilateral pleural effusion. BNP elevated to 495  - Echocardiogram this admission showed EF 40-45%, mild LVH, grade I DD, moderately reduced RV systolic function, mild MR - Patient treated with IV lasix , transitioned to PO lasix  40 mg BID today  - Currently net -5.7 L since admission. Renal function stable with creatinine 1.00  - Pending R/L heart catheterization today  - Continue carvedilol  3.125 mg BID  - Currently on lisinopril  40  mg daily. With reduced EF, would like for her to be on entresto . Received lisinopril  this AM. Stop, plan to start entresto  tomorrow evening after 36 hour wash out  - A1c is 10.4 this admission, would like for it to be lower prior to starting SGLT2i  - Start spiro 12.5 mg daily today   Otherwise per primary  - Asthma  - Morbid Obesity  - Type 2 DM    For questions or updates, please contact [facility] Please consult [email or web address] for contact info under       Signed, Rollo FABIENE Louder, PA-C   Personally seen and examined. Agree with above.  Awaiting cardiac catheterization, no further chest pain, EF 40%, p.o. Lasix  40 mg twice daily, creatinine 1.0, right left heart catheterization today, lisinopril  has been stopped to allow for Entresto , spironolactone  12.5 mg started.  Morbid obesity.  Oneil Parchment, MD

## 2024-06-20 NOTE — TOC Transition Note (Signed)
 Transition of Care [facility]) - Discharge Note   Patient Details  Name: Ann Holt MRN: [id number] Date of Birth: [birth year]  [facility] CM/SW Contact:  Waddell Barnie Rama, RN Phone Number: 06/20/2024, 2:06 PM   Clinical Narrative:    For dc today, she will need a rolling walker per pt eval.  NCM offered choice, she has no preference.  NCM made referral to Zach with [employer].  This will be given to patient prior to dc.     Barriers to Discharge: Continued Medical Work up   Patient Goals and CMS Choice            Discharge Placement                       Discharge Plan and Services Additional resources added to the After Visit Summary for                                       Social Drivers of Health (SDOH) Interventions SDOH Screenings   Food Insecurity: No Food Insecurity (06/17/2024)  Housing: Unknown (06/17/2024)  Transportation Needs: No Transportation Needs (06/17/2024)  Utilities: Not At Risk (06/17/2024)  Financial Resource Strain: Low Risk  (06/17/2024)  Physical Activity: Insufficiently Active (07/06/2023)   Received from [facility]  Social Connections: Socially Integrated (07/06/2023)   Received from [facility]  Stress: No Stress Concern Present (07/06/2023)   Received from [facility]  Tobacco Use: Unknown (06/16/2024)     Readmission Risk Interventions     No data to display

## 2024-06-20 NOTE — Plan of Care (Signed)
   Problem: Education: Goal: Ability to describe self-care measures that may prevent or decrease complications (Diabetes Survival Skills Education) will improve Outcome: Progressing Goal: Individualized Educational Video(s) Outcome: Progressing   Problem: Coping: Goal: Ability to adjust to condition or change in health will improve Outcome: Progressing

## 2024-06-20 NOTE — Discharge Summary (Addendum)
 Physician Discharge Summary   Patient: Ann Holt MRN: [id number] DOB: 09-01-1961  Admit date:     06/16/2024  Discharge date: 06/20/24  Discharge Physician: Elidia Sieving Lesta Limbert   PCP: Pura Lenis, MD   Recommendations at discharge:    Patient has been placed on heart failure guideline medical therapy with spironolactone , carvedilol  and losartan . Plan to start Entresto  as outpatient  Diuresis with furosemide  40 mg po bid. Follow up renal function and electrolytes as outpatient in 7 days Follow up with Dr Pura in 7 to 10 days  Follow up with Cardiology as outpatient as scheduled.   Discharge Diagnoses: Principal Problem:   Acute on chronic diastolic CHF (congestive heart failure) (HCC) Active Problems:   Essential hypertension   Hypokalemia   Type 2 diabetes mellitus with hyperlipidemia (HCC)   Asthma, chronic   Obesity, class 2  Resolved Problems:   * No resolved hospital problems. [facility] Course: Ann Holt was admitted to the hospital with the working diagnosis of heart failure exacerbation.   63 yo female with the past medical history of asthma, hypertension, obesity and T2DM who presented with dyspnea.  Reported acute worsening of dyspnea for 48 hrs prior to admission, associated with chest tightness and inability to take a deep breath. She called EMS and she was brought to the hospital. On her initial physical examination her blood pressure was 179/98, HR 93 RR 20 and 02 saturation 99% Lungs with bilateral rales with no wheezing, heart with S1 and S2 present and regular, with no gallops or rubs, abdomen with no distention and no lower extremity edema.   Na 142, K 4.3 Cl 103 bicarbonate 24 glucose 195 bun 12 cr 0,94  AST 26 ALT 19  BNP 505  Wbc 9,0 hgb 15.3 plt 383   Sars covid 19 negative  Influenza negative  RSV negative   Chest radiograph with cardiomegaly, bilateral hilar vascular congestion, with bilateral pleural effusions, more right than  left. CT chest with bilateral ground glass opacities, with interlobular septal thickening. Bilateral pleural effusions, more right than left.  No pulmonary embolism. Persistent bilateral hilar and mediastinal lymphadenopathy.   EKG 117 bpm, normal axis, normal intervals, qtc 485, sinus rhythm with no significant ST segment or T wave changes.  08/10 volume status is improving, plan for cardiac catheterization tomorrow  08/11 cardiac catheterization with normalized filling pressures. Normal coronaries.  Plan to follow up as outpatient   Assessment and Plan: * Acute on chronic diastolic CHF (congestive heart failure) (HCC) Echocardiogram with mildly reduced LV systolic function 40 to 45%, mild concentric LVH, diffuse hypokinesis, worse in the basal inferior and inferolateral walls. RV systolic function with moderate reduction, RA with moderate dilatation mild mitral valve regurgitation, no significant valvular disease.   Patient was placed on furosemide  IV for diuresis, negative fluid balance was achieved, - 6,116 ml, with significant improvement in her symptoms.   08/11 cardiac catheterization  RA 5 RV 42/9 PA 39/15 mean 25  PCWP 8  Cardiac output 4.76 and index 2.46   Compensated heart failure with increased pulmonary pressure, pre capillary.    Continue carvedilol , spironolactone  and will add losartan  with the intention to change to entresto  as outpatient.    Hold on SGLT 2 inh considering her body habitus and risk of urine infections.   Essential hypertension Continue blood pressure control with carvedilol  and losartan    Hypokalemia Renal function has remained stable, at discharge her serum cr at 1,0, K is 4,0 and serum bicarbonate  at 29, and Mg 2.3 Na 140   Patient will continue diuresis with furosemide  40 mg po bid and spironolactone .  Follow up renal function and electrolytes as outpatient   Type 2 diabetes mellitus with hyperlipidemia (HCC) Continue glucose cover and  monitoring with insulin  sliding scale Her glucose remained stable during her hospitalization  Resume her glipizide  and dulaglutide at the time of discharge.   Continue with statin   Asthma, chronic No signs of acute exacerbation Continue with bronchodilator therapy  Obesity, class 2 Calculated BMI is 37.3        Consultants: cardiology  Procedures performed: cardiac catheterization   Disposition: Home Diet recommendation:  Cardiac and Carb modified diet DISCHARGE MEDICATION: Allergies as of 06/20/2024       Reactions   Penicillins Hives        Medication List     STOP taking these medications    ibuprofen 200 MG tablet Commonly known as: ADVIL   lisinopril  20 MG tablet Commonly known as: ZESTRIL        TAKE these medications    albuterol  108 (90 Base) MCG/ACT inhaler Commonly known as: VENTOLIN  HFA Inhale 2 puffs into the lungs every 4 (four) hours as needed for wheezing or shortness of breath. For shortness of breath.   Blood Glucose Monitoring Suppl Devi 1 each by Does not apply route in the morning, at noon, and at bedtime. May substitute to any manufacturer covered by patient's insurance.   BLOOD GLUCOSE TEST STRIPS Strp 1 each by In Vitro route in the morning, at noon, and at bedtime. May substitute to any manufacturer covered by patient's insurance.   carvedilol  3.125 MG tablet Commonly known as: COREG  Take 1 tablet (3.125 mg total) by mouth 2 (two) times daily with a meal.   Dulaglutide 1.5 MG/0.5ML Soaj Inject 1.5 mg into the skin once a week. Inject 1.5mg  subcutaneously weekly on Sunday.   furosemide  40 MG tablet Commonly known as: LASIX  Take 1 tablet (40 mg total) by mouth 2 (two) times daily.   glipiZIDE  10 MG 24 hr tablet Commonly known as: GLUCOTROL  XL Take 10 mg by mouth.   Lancet Device Misc 1 each by Does not apply route in the morning, at noon, and at bedtime. May substitute to any manufacturer covered by patient's insurance.    Lancets Misc. Misc 1 each by Does not apply route in the morning, at noon, and at bedtime. May substitute to any manufacturer covered by patient's insurance.   loratadine  10 MG tablet Commonly known as: CLARITIN  Take 10 mg by mouth daily.   losartan  25 MG tablet Commonly known as: COZAAR  Take 1 tablet (25 mg total) by mouth daily. Start taking on: June 21, 2024   spironolactone  25 MG tablet Commonly known as: ALDACTONE  Take 0.5 tablets (12.5 mg total) by mouth daily. Start taking on: June 21, 2024   traMADol 50 MG tablet Commonly known as: ULTRAM Take 50 mg by mouth daily as needed for moderate pain (pain score 4-6).        Follow-up Information     [facility]. Go in 7 day(s).   Specialty: Cardiology Why: Hospital follow up 06/24/2024 @ 10:30 am PLEASE bring a current medication list to appointment FREE valet parking, Entrance C, off [address] [employer]. Contact information: [address] [place]  [address] [phone]        Pura Lenis, MD Follow up on 06/30/2024.  Specialty: Family Medicine Why: 9 am for hospital follow up Contact information: [address] [place] [address] [phone]                Discharge Exam: Ann Holt   06/18/24 0354 06/19/24 0432 06/20/24 0446  Weight: 92.7 kg 92.2 kg 93.3 kg   BP 135/82   Pulse 88   Temp 97.8 F (36.6 C)   Resp 17   Ht 5' 2 (1.575 m)   Wt 93.3 kg   LMP 12/26/2012   SpO2 90%   BMI 37.62 kg/m   Patient is feeling better with no chest pain, dyspnea and edema have improved, no PND or orthopnea   Neurology awake and alert ENT with mild pallor with no icterus Cardiovascular with S1 and S2 present and regular with no gallops, rubs or murmurs Respiratory with no rales or wheezing, no rhonchi  Abdomen protuberant, non tender and not distended Trace non  pitting lower extremity edema   Condition at discharge: stable  The results of significant diagnostics from this hospitalization (including imaging, microbiology, ancillary and laboratory) are listed below for reference.   Imaging Studies: CARDIAC CATHETERIZATION Result Date: 06/20/2024 1.  Angiographically normal coronary arteries with no stenoses 2.  Normal LVEDP of 9 mmHg 3.  Right heart catheterization data: Right atrial mean pressure 5 mmHg RV pressure 42/9 mmHg PA 39/15 mean 25 mmHg Pulmonary wedge pressure mean of 8 mmHg Cardiac output of 4.76 L/min, cardiac index 2.46 L/min/m Recommendations: Patient appears to have well compensated hemodynamics after diuresis.  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   ECHOCARDIOGRAM COMPLETE Result Date: 06/17/2024    ECHOCARDIOGRAM REPORT   Patient Name:   Ann Holt Date of Exam: 06/17/2024 Medical Rec #:  [id number]     Height:       62.0 in Accession #:    [id number]    Weight:       212.4 lb Date of Birth:  08-Oct-1961     BSA:          1.961 m Patient Age:    62 years      BP:           138/85 mmHg Patient Gender: F             HR:           93 bpm. Exam Location:  Inpatient Procedure: 2D Echo, Cardiac Doppler and Color Doppler (Both Spectral and Color            Flow Doppler were utilized during procedure). Indications:    CHF- Acute Diastolic  History:        Patient has no prior history of Echocardiogram examinations.                 CHF; Risk Factors:Hypertension.  Sonographer:    Vella Key Referring Phys: [id number] JUSTIN B HOWERTER IMPRESSIONS  1. Diffuse hypokinesis, worse in the the basal inferior, inferolateral walls. Left ventricular ejection fraction, by estimation, is 40 to 45%. The left ventricle has mildly decreased function. There is mild concentric left ventricular hypertrophy. Left ventricular diastolic parameters are consistent with Grade I diastolic dysfunction (impaired relaxation). Elevated left atrial pressure.  2.  Right ventricular systolic function is moderately reduced. The right ventricular size is normal.  3. Right atrial size was moderately dilated.  4. Mild mitral valve regurgitation.  5. The aortic valve is tricuspid. Aortic valve regurgitation is not visualized. Aortic valve sclerosis/calcification is present, without  any evidence of aortic stenosis. FINDINGS  Left Ventricle: Diffuse hypokinesis, worse in the the basal inferior, inferolateral walls. Left ventricular ejection fraction, by estimation, is 40 to 45%. The left ventricle has mildly decreased function. The left ventricular internal cavity size was normal in size. There is mild concentric left ventricular hypertrophy. Left ventricular diastolic parameters are consistent with Grade I diastolic dysfunction (impaired relaxation). Elevated left atrial pressure. Right Ventricle: The right ventricular size is normal. Right vetricular wall thickness was not assessed. Right ventricular systolic function is moderately reduced. Left Atrium: Left atrial size was normal in size. Right Atrium: Right atrial size was moderately dilated. Pericardium: There is no evidence of pericardial effusion. Mitral Valve: There is mild thickening of the mitral valve leaflet(s). Mild mitral annular calcification. Mild mitral valve regurgitation. Tricuspid Valve: The tricuspid valve is normal in structure. Tricuspid valve regurgitation is trivial. Aortic Valve: The aortic valve is tricuspid. Aortic valve regurgitation is not visualized. Aortic valve sclerosis/calcification is present, without any evidence of aortic stenosis. Pulmonic Valve: The pulmonic valve was normal in structure. Pulmonic valve regurgitation is trivial. Aorta: The aortic root and ascending aorta are structurally normal, with no evidence of dilitation. IAS/Shunts: No atrial level shunt detected by color flow Doppler.  LEFT VENTRICLE PLAX 2D LVIDd:         5.38 cm      Diastology LVIDs:         4.27 cm      LV e' medial:     8.05 cm/s LV PW:         1.24 cm      LV E/e' medial:  13.4 LV IVS:        1.21 cm      LV e' lateral:   6.74 cm/s LVOT diam:     1.88 cm      LV E/e' lateral: 16.0 LV SV:         58 LV SV Index:   30 LVOT Area:     2.78 cm  LV Volumes (MOD) LV vol d, MOD A2C: 161.0 ml LV vol d, MOD A4C: 133.0 ml LV vol s, MOD A2C: 74.5 ml LV vol s, MOD A4C: 83.8 ml LV SV MOD A2C:     86.5 ml LV SV MOD A4C:     133.0 ml LV SV MOD BP:      75.4 ml RIGHT VENTRICLE RV Basal diam:  3.37 cm RV S prime:     9.57 cm/s TAPSE (M-mode): 3.4 cm LEFT ATRIUM           Index        RIGHT ATRIUM           Index LA diam:      4.00 cm 2.04 cm/m   RA Area:     18.60 cm LA Vol (A4C): 53.1 ml 27.08 ml/m  RA Volume:   53.20 ml  27.13 ml/m  AORTIC VALVE LVOT Vmax:   111.00 cm/s LVOT Vmean:  76.200 cm/s LVOT VTI:    0.209 m  AORTA Ao Root diam: 2.93 cm Ao Asc diam:  2.89 cm MITRAL VALVE MV Area (PHT): 9.25 cm     SHUNTS MV Decel Time: 82 msec      Systemic VTI:  0.21 m MV E velocity: 108.00 cm/s  Systemic Diam: 1.88 cm MV A velocity: 79.10 cm/s MV E/A ratio:  1.37 Vina Gull MD Electronically signed by Vina Gull MD Signature Date/Time: 06/17/2024/3:31:38 PM    Final  CT Angio Chest PE W/Cm &/Or Wo Cm Result Date: 06/17/2024 CLINICAL DATA:  Pulmonary embolism (PE) suspected, high prob OB x2 days. Hx of asthma, HTN and DBM. Pt used inhaler x2 tonight with some relief. Per EMS lungs clear, EMS vitals 95% RA, 180/110 BP has not taken meds, 250 CBG. PT states ran out of nebulizer solution EXAM: CT ANGIOGRAPHY CHEST WITH CONTRAST TECHNIQUE: Multidetector CT imaging of the chest was performed using the standard protocol during bolus administration of intravenous contrast. Multiplanar CT image reconstructions and MIPs were obtained to evaluate the vascular anatomy. RADIATION DOSE REDUCTION: This exam was performed according to the departmental dose-optimization program which includes automated exposure control, adjustment of the mA and/or kV according  to patient size and/or use of iterative reconstruction technique. CONTRAST:  75mL OMNIPAQUE  IOHEXOL  350 MG/ML SOLN COMPARISON:  CT angio chest 04/14/2024 FINDINGS: Cardiovascular: Satisfactory opacification of the pulmonary arteries to the segmental level. No evidence of pulmonary embolism. Prominent heart size. No significant pericardial effusion. The thoracic aorta is normal in caliber. No atherosclerotic plaque of the thoracic aorta. No coronary artery calcifications. Mediastinum/Nodes: Persistent mediastinal lymphadenopathy with as an example a subcarinal 1 cm lymph node and a pre-vascular 1.4 cm lymph node. Persistent bilateral hilar lymphadenopathy with a 1.5 cm right lymph node. No axillary lymphadenopathy. Thyroid gland, trachea, and esophagus demonstrate no significant findings. Lungs/Pleura: Mild interlobular septal wall thickening. Right middle lobe opacity (11:13). No pulmonary nodule. No pulmonary mass. Small right pleural effusion. Trace left pleural effusion. No pneumothorax. Upper Abdomen: No acute abnormality. Musculoskeletal: No chest wall abnormality. No suspicious lytic or blastic osseous lesions. No acute displaced fracture. Multilevel degenerative changes of the spine. Review of the MIP images confirms the above findings. IMPRESSION: 1. No pulmonary embolus. 2. Pulmonary edema with small right and trace left pleural effusion. 3. Right middle lobe consolidation versus atelectasis. 4. Persistent bilateral hilar and mediastinal lymphadenopathy. Unclear etiology. Additional imaging evaluation or consultation with Pulmonology recommended. Electronically Signed   By: Morgane  Naveau M.D.   On: 06/17/2024 01:55   DG Chest 2 View Result Date: 06/16/2024 CLINICAL DATA:  Shortness of breath EXAM: CHEST - 2 VIEW COMPARISON:  Radiograph and CT 04/14/2024 FINDINGS: Cardiomegaly. Pulmonary vascular congestion. Right basilar airspace opacities. No pleural effusion or pneumothorax. IMPRESSION: Right basilar  airspace opacities may be due to atelectasis or pneumonia. Cardiomegaly and pulmonary vascular congestion. Electronically Signed   By: Norman Gatlin M.D.   On: 06/16/2024 23:05    Microbiology: Results for orders placed or performed during the hospital encounter of 06/16/24  Resp panel by RT-PCR (RSV, Flu A&B, Covid) Anterior Nasal Swab     Status: None   Collection Time: 06/17/24  6:12 AM   Specimen: Anterior Nasal Swab  Result Value Ref Range Status   SARS Coronavirus 2 by RT PCR NEGATIVE NEGATIVE Final   Influenza A by PCR NEGATIVE NEGATIVE Final   Influenza B by PCR NEGATIVE NEGATIVE Final    Comment: (NOTE) The Xpert Xpress SARS-CoV-2/FLU/RSV plus assay is intended as an aid in the diagnosis of influenza from Nasopharyngeal swab specimens and should not be used as a sole basis for treatment. Nasal washings and aspirates are unacceptable for Xpert Xpress SARS-CoV-2/FLU/RSV testing.  Fact Sheet for Patients: BloggerCourse.com  Fact Sheet for Healthcare Providers: SeriousBroker.it  This test is not yet approved or cleared by the [place]  FDA and has been authorized for detection and/or diagnosis of SARS-CoV-2 by FDA under an Emergency Use Authorization (EUA). This EUA will  remain in effect (meaning this test can be used) for the duration of the COVID-19 declaration under Section 564(b)(1) of the Act, 21 U.S.C. section 360bbb-3(b)(1), unless the authorization is terminated or revoked.     Resp Syncytial Virus by PCR NEGATIVE NEGATIVE Final    Comment: (NOTE) Fact Sheet for Patients: BloggerCourse.com  Fact Sheet for Healthcare Providers: SeriousBroker.it  This test is not yet approved or cleared by the [place]  FDA and has been authorized for detection and/or diagnosis of SARS-CoV-2 by FDA under an Emergency Use Authorization (EUA). This EUA will remain in effect  (meaning this test can be used) for the duration of the COVID-19 declaration under Section 564(b)(1) of the Act, 21 U.S.C. section 360bbb-3(b)(1), unless the authorization is terminated or revoked.  Performed at [facility], [address], [place], [place] [address]     Labs: CBC: Recent Labs  Lab 06/16/24 2232 06/17/24 1225  WBC 7.4 9.0  NEUTROABS  --  6.5  HGB 14.6 15.3*  HCT 45.6 47.2*  MCV 90.8 88.6  PLT 322 383   Basic Metabolic Panel: Recent Labs  Lab 06/16/24 2232 06/17/24 1225 06/18/24 0255 06/19/24 0303 06/20/24 0338  NA 140 142 142 143 140  K 3.6 4.3 3.3* 4.2 4.0  CL 105 103 102 101 99  CO2 26 24 30 29 29   GLUCOSE 208* 195* 141* 153* 148*  BUN 13 12 14 22  24*  CREATININE 0.75 0.94 0.93 0.95 1.00  CALCIUM 8.2* 8.9 8.5* 8.5* 8.6*  MG  --  1.7  --  1.8 2.3   Liver Function Tests: Recent Labs  Lab 06/17/24 1225  AST 26  ALT 19  ALKPHOS 119  BILITOT 1.3*  PROT 7.7  ALBUMIN 3.7   CBG: Recent Labs  Lab 06/19/24 1119 06/19/24 1608 06/19/24 2107 06/20/24 0628 06/20/24 1124  GLUCAP 127* 174* 158* 119* 134*    Discharge time spent: greater than 30 minutes.  Signed: Elidia Toribio Furnace, MD [facility] 06/20/2024

## 2024-06-20 NOTE — H&P (View-Only) (Signed)
  Progress Note  Patient Name: Ann Holt Date of Encounter: 06/20/2024 [facility] Cardiologist: Madonna Large, DO    Interval Summary   Patient reports feeling well this AM. No chest pain. No shortness of breath. No lower extremity swelling. Scheduled for cath today   Vital Signs Vitals:   06/19/24 2009 06/20/24 0031 06/20/24 0446 06/20/24 0718  BP: 137/82 131/65 (!) 144/86 (!) 158/80  Pulse: 87 84 84 80  Resp: 20 20 20 20   Temp: 97.7 F (36.5 C) 98.3 F (36.8 C) 97.9 F (36.6 C) 98 F (36.7 C)  TempSrc: Oral Oral Oral Oral  SpO2: 95% 93% 92% 95%  Weight:   93.3 kg   Height:        Intake/Output Summary (Last 24 hours) at 06/20/2024 0851 Last data filed at 06/20/2024 0834 Gross per 24 hour  Intake 49.21 ml  Output 400 ml  Net -350.79 ml      06/20/2024    4:46 AM 06/19/2024    4:32 AM 06/18/2024    3:54 AM  Last 3 Weights  Weight (lbs) 205 lb 11.2 oz 203 lb 3.2 oz 204 lb 4.8 oz  Weight (kg) 93.305 kg 92.171 kg 92.67 kg      Telemetry/ECG  NSR - Personally Reviewed  Physical Exam  GEN: No acute distress.  Laying flat in the bed. Asleep but arouses easily to voice  Neck: No JVD Cardiac:  RRR, no murmurs, rubs, or gallops.  Respiratory: Clear to auscultation bilaterally. 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 CTA chest showed evidence of pulmonary edema, bilateral pleural effusion. BNP elevated to 495  - Echocardiogram this admission showed EF 40-45%, mild LVH, grade I DD, moderately reduced RV systolic function, mild MR - Patient treated with IV lasix , transitioned to PO lasix  40 mg BID today  - Currently net -5.7 L since admission. Renal function stable with creatinine 1.00  - Pending R/L heart catheterization today  - Continue carvedilol  3.125 mg BID  - Currently on lisinopril  40  mg daily. With reduced EF, would like for her to be on entresto. Received lisinopril  this AM. Stop, plan to start entresto tomorrow evening after 36 hour wash out  - A1c is 10.4 this admission, would like for it to be lower prior to starting SGLT2i  - Start spiro 12.5 mg daily today   Otherwise per primary  - Asthma  - Morbid Obesity  - Type 2 DM    For questions or updates, please contact [facility] Please consult [email or web address] for contact info under       Signed, Rollo FABIENE Louder, PA-C   Personally seen and examined. Agree with above.  Awaiting cardiac catheterization, no further chest pain, EF 40%, p.o. Lasix  40 mg twice daily, creatinine 1.0, right left heart catheterization today, lisinopril  has been stopped to allow for Entresto, spironolactone  12.5 mg started.  Morbid obesity.  Oneil Parchment, MD

## 2024-06-20 NOTE — Interval H&P Note (Signed)
 History and Physical Interval Note:  06/20/2024 11:32 AM  Ann Holt  has presented today for surgery, with the diagnosis of chf.  The various methods of treatment have been discussed with the patient and family. After consideration of risks, benefits and other options for treatment, the patient has consented to  Procedure(s): RIGHT/LEFT HEART CATH AND CORONARY ANGIOGRAPHY (N/A) as a surgical intervention.  The patient's history has been reviewed, patient examined, no change in status, stable for surgery.  I have reviewed the patient's chart and labs.  Questions were answered to the patient's satisfaction.     Ozell Fell

## 2024-06-21 ENCOUNTER — Encounter (HOSPITAL_COMMUNITY): Payer: Self-pay | Admitting: Cardiovascular Disease

## 2024-06-23 ENCOUNTER — Telehealth (HOSPITAL_COMMUNITY): Payer: Self-pay

## 2024-06-23 NOTE — Telephone Encounter (Signed)
 Called to confirm/remind patient of their appointment at [facility] on 8/15/202510:30.   Appointment:   [] Confirmed  [] Left mess   [x] No answer/No voice mail  [] VM Full/unable to leave message  [] Phone not in service  Patient reminded to bring all medications and/or complete list.  Confirmed patient has transportation. Gave directions, instructed to utilize valet parking.

## 2024-06-23 NOTE — Progress Notes (Signed)
 This patient's chart has been reviewed by a [facility] Specialist.   Attempted to contact patient in order to discuss appointments and health maintenance screenings due for the upcoming year.   UTR (No voicemail / No phone number / Invalid phone number). and [employer] message with health maintenance recommendations and [facility] Specialist's contact information..  Additional Comments: no vm

## 2024-06-24 ENCOUNTER — Other Ambulatory Visit (HOSPITAL_COMMUNITY): Payer: Self-pay

## 2024-06-24 ENCOUNTER — Telehealth (HOSPITAL_COMMUNITY): Payer: Self-pay | Admitting: Pharmacy Technician

## 2024-06-24 ENCOUNTER — Ambulatory Visit (HOSPITAL_COMMUNITY)
Admission: RE | Admit: 2024-06-24 | Discharge: 2024-06-24 | Disposition: A | Discharge status: Home / Self Care | Source: Ambulatory Visit | Attending: Family Medicine | Admitting: Family Medicine

## 2024-06-24 ENCOUNTER — Encounter (HOSPITAL_COMMUNITY): Payer: Self-pay

## 2024-06-24 ENCOUNTER — Ambulatory Visit (HOSPITAL_COMMUNITY): Payer: Self-pay | Admitting: Family Medicine

## 2024-06-24 VITALS — BP 140/76 | HR 85 | Ht 62.0 in | Wt 203.7 lb

## 2024-06-24 DIAGNOSIS — E1165 Type 2 diabetes mellitus with hyperglycemia: Secondary | ICD-10-CM | POA: Insufficient documentation

## 2024-06-24 DIAGNOSIS — E119 Type 2 diabetes mellitus without complications: Secondary | ICD-10-CM | POA: Diagnosis not present

## 2024-06-24 DIAGNOSIS — G5603 Carpal tunnel syndrome, bilateral upper limbs: Secondary | ICD-10-CM | POA: Insufficient documentation

## 2024-06-24 DIAGNOSIS — I10 Essential (primary) hypertension: Secondary | ICD-10-CM | POA: Diagnosis not present

## 2024-06-24 DIAGNOSIS — I5022 Chronic systolic (congestive) heart failure: Secondary | ICD-10-CM | POA: Diagnosis not present

## 2024-06-24 DIAGNOSIS — E669 Obesity, unspecified: Secondary | ICD-10-CM | POA: Insufficient documentation

## 2024-06-24 DIAGNOSIS — Z8249 Family history of ischemic heart disease and other diseases of the circulatory system: Secondary | ICD-10-CM | POA: Insufficient documentation

## 2024-06-24 DIAGNOSIS — Z79899 Other long term (current) drug therapy: Secondary | ICD-10-CM | POA: Diagnosis not present

## 2024-06-24 DIAGNOSIS — I110 Hypertensive heart disease with heart failure: Secondary | ICD-10-CM | POA: Diagnosis present

## 2024-06-24 DIAGNOSIS — Z7985 Long-term (current) use of injectable non-insulin antidiabetic drugs: Secondary | ICD-10-CM | POA: Diagnosis not present

## 2024-06-24 DIAGNOSIS — M791 Myalgia, unspecified site: Secondary | ICD-10-CM | POA: Diagnosis not present

## 2024-06-24 DIAGNOSIS — Z6837 Body mass index (BMI) 37.0-37.9, adult: Secondary | ICD-10-CM | POA: Diagnosis not present

## 2024-06-24 DIAGNOSIS — R0683 Snoring: Secondary | ICD-10-CM | POA: Insufficient documentation

## 2024-06-24 DIAGNOSIS — M25569 Pain in unspecified knee: Secondary | ICD-10-CM | POA: Diagnosis not present

## 2024-06-24 DIAGNOSIS — Z794 Long term (current) use of insulin: Secondary | ICD-10-CM

## 2024-06-24 DIAGNOSIS — Z7984 Long term (current) use of oral hypoglycemic drugs: Secondary | ICD-10-CM | POA: Insufficient documentation

## 2024-06-24 LAB — BASIC METABOLIC PANEL WITH GFR
Anion gap: 12 (ref 5–15)
BUN: 25 mg/dL — ABNORMAL HIGH (ref 8–23)
CO2: 27 mmol/L (ref 22–32)
Calcium: 9.2 mg/dL (ref 8.9–10.3)
Chloride: 100 mmol/L (ref 98–111)
Creatinine, Ser: 1.04 mg/dL — ABNORMAL HIGH (ref 0.44–1.00)
GFR, Estimated: >60 mL/min (ref >60)
Glucose, Bld: 225 mg/dL — ABNORMAL HIGH (ref 70–99)
Potassium: 4.5 mmol/L (ref 3.5–5.1)
Sodium: 139 mmol/L (ref 135–145)

## 2024-06-24 LAB — BRAIN NATRIURETIC PEPTIDE: B Natriuretic Peptide: 129.2 pg/mL — ABNORMAL HIGH (ref 0.0–100.0)

## 2024-06-24 MED ORDER — SPIRONOLACTONE 25 MG PO TABS
12.5000 mg | ORAL_TABLET | Freq: Every day | ORAL | 6 refills | Status: DC
Start: 2024-06-24 — End: 2024-09-14

## 2024-06-24 MED ORDER — FUROSEMIDE 40 MG PO TABS: 40.0000 mg | ORAL_TABLET | Freq: Two times a day (BID) | ORAL | 6 refills | Status: DC

## 2024-06-24 MED ORDER — CARVEDILOL 3.125 MG PO TABS: 3.1250 mg | ORAL_TABLET | Freq: Two times a day (BID) | ORAL | 6 refills | Status: DC

## 2024-06-24 MED ORDER — ENTRESTO 24-26 MG PO TABS: 1.0000 | ORAL_TABLET | Freq: Two times a day (BID) | ORAL | 6 refills | Status: DC

## 2024-06-24 NOTE — Progress Notes (Addendum)
 HEART & VASCULAR TRANSITION OF CARE CONSULT NOTE    Referring Physician: Dr. Noralee PCP: Pura Lenis, MD   HPI: Referred to clinic by Dr. Arrien for heart failure consultation.   Ann Holt is a 63 y.o. female with a hx of DM2, asthma, HTN, obesity, and newly diagnosed HFmrEF.   Admitted 8/25 with hypertensive urgency, and found to have new systolic heart failure. BP in ED 192/125. Echo showed EF 40-45%, diffuse LVHK, G1DD, RV moderately reduced. Underwent R/LHC showing normal cors, well-compensated hemodynamics after diuresis. 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 GDMT titrated and she was discharged home, weight 205 lbs.  Today she presents to [facility] for post hospital HF follow up with her daughter. Overall feeling fine. She lives alone, she is not SOB with ADLs. She walks with a cane due to knee pain. Denies palpitations, abnormal bleeding, CP, dizziness, edema, or PND/Orthopnea. Appetite ok. Weight at home 203 pounds. Taking all medications. She snores. Has bilateral carpal tunnel. Retired from post office. Lost 100 lbs in past 4 years.  ECG (personally reviewed): NSR 92 bpm  Labs (8/25): K 4.0, creatinine 1.0   Cardiac Testing  - R/LHC (8/25): normal cors, RA 5, PA 39/15 (25), PCWP 8, CO/CI (Fick) 4.76/2.46, PAPi 4.8 - Echo (8/25): EF 40-45%, mild LVH, G1DD, RV moderately reduced.  Family Hx: mother with HTN and CHF, father with HTN Social Hx: no ETOH, drug or tobacco use  Past Medical History:  Diagnosis Date   Asthma    Diabetes mellitus without complication (HCC)    Hypertension    Current Outpatient Medications  Medication Sig Dispense Refill   albuterol  (PROVENTIL  HFA;VENTOLIN  HFA) 108 (90 BASE) MCG/ACT inhaler Inhale 2 puffs into the lungs every 4 (four) hours as needed for wheezing or shortness of breath. For shortness of breath.     Blood Glucose Monitoring Suppl DEVI 1 each by Does not apply route in the morning, at noon, and at bedtime. May substitute to any manufacturer  covered by patient's insurance. 1 each 0   carvedilol  (COREG ) 3.125 MG tablet Take 1 tablet (3.125 mg total) by mouth 2 (two) times daily with a meal. 60 tablet 0   Dulaglutide 1.5 MG/0.5ML SOAJ Inject 1.5 mg into the skin once a week. Inject 1.5mg  subcutaneously weekly on Sunday.     furosemide  (LASIX ) 40 MG tablet Take 1 tablet (40 mg total) by mouth 2 (two) times daily. 60 tablet 0   glipiZIDE  (GLUCOTROL  XL) 10 MG 24 hr tablet Take 10 mg by mouth.     Glucose Blood (BLOOD GLUCOSE TEST STRIPS) STRP 1 each by In Vitro route in the morning, at noon, and at bedtime. May substitute to any manufacturer covered by patient's insurance. 90 each 0   Lancet Device MISC 1 each by Does not apply route in the morning, at noon, and at bedtime. May substitute to any manufacturer covered by patient's insurance. 1 each 0   Lancets Misc. MISC 1 each by Does not apply route in the morning, at noon, and at bedtime. May substitute to any manufacturer covered by patient's insurance. 100 each 0   loratadine  (CLARITIN ) 10 MG tablet Take 10 mg by mouth daily.     losartan  (COZAAR ) 25 MG tablet Take 1 tablet (25 mg total) by mouth daily. 30 tablet 0   spironolactone  (ALDACTONE ) 25 MG tablet Take 0.5 tablets (12.5 mg total) by mouth daily. 15 tablet 0   traMADol (ULTRAM) 50 MG tablet Take 50 mg by mouth daily as  needed for moderate pain (pain score 4-6).     No current facility-administered medications for this encounter.   Allergies  Allergen Reactions   Penicillins Hives   Social History   Socioeconomic History   Marital status: Single    Spouse name: Not on file   Number of children: 1   Years of education: Not on file   Highest education level: High school graduate  Occupational History   Occupation: Retired  Tobacco Use   Smoking status: Never   Smokeless tobacco: Not on file  Vaping Use   Vaping status: Never Used  Substance and Sexual Activity   Alcohol use: No   Drug use: No   Sexual activity:  Not Currently    Birth control/protection: None  Other Topics Concern   Not on file  Social History Narrative   Not on file   Social Drivers of Health   Financial Resource Strain: Low Risk  (06/17/2024)   Overall Financial Resource Strain (CARDIA)    Difficulty of Paying Living Expenses: Not hard at all  Food Insecurity: No Food Insecurity (06/17/2024)   Hunger Vital Sign    Worried About Running Out of Food in the Last Year: Never true    Ran Out of Food in the Last Year: Never true  Transportation Needs: No Transportation Needs (06/17/2024)   PRAPARE - Administrator, Civil Service (Medical): No    Lack of Transportation (Non-Medical): No  Physical Activity: Insufficiently Active (07/06/2023)   Received from [facility]   Exercise Vital Sign    On average, how many days per week do you engage in moderate to strenuous exercise (like a brisk walk)?: 2 days    On average, how many minutes do you engage in exercise at this level?: 20 min  Stress: No Stress Concern Present (07/06/2023)   Received from [facility] of Occupational Health - Occupational Stress Questionnaire    Feeling of Stress : Not at all  Social Connections: Socially Integrated (07/06/2023)   Received from [facility]   Social Network    How would you rate your social network (family, work, friends)?: Good participation with social networks  Intimate Partner Violence: Not At Risk (06/17/2024)   Humiliation, Afraid, Rape, and Kick questionnaire    Fear of Current or Ex-Partner: No    Emotionally Abused: No    Physically Abused: No    Sexually Abused: No    History reviewed. No pertinent family history.  Wt Readings from Last 3 Encounters:  06/24/24 92.4 kg (203 lb 11.2 oz)  06/20/24 93.3 kg (205 lb 11.2 oz)  11/02/23 95.3 kg (210 lb)   BP (!) 140/76   Pulse 85   Ht 5' 2 (1.575 m)   Wt 92.4 kg (203 lb 11.2 oz)   LMP 12/26/2012   SpO2 95%   BMI 37.26 kg/m   PHYSICAL  EXAM: General:  NAD. No resp difficulty, arrived in [facility] HEENT: Normal Neck: Supple. No JVD. Cor: Regular rate & rhythm. No rubs, gallops or murmurs. Lungs: Clear Abdomen: Soft, obese, nontender, nondistended.  Extremities: No cyanosis, clubbing, rash, edema Neuro: Alert & oriented x 3, moves all 4 extremities w/o difficulty. Affect pleasant.  ASSESSMENT & PLAN: 1. HFmrEF: Newly diagnosed. Echo 8/25 showed EF 40-45%. R/LHC showed no CAD, well-compensated filling pressures. 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 Does have family history. No recent viral-type illnesses. With LVH and carpal tunnel, consider amyloidosis. Will arrange CMRI first to  evaluate for evidence of infiltrative/inflammatory disease. NYHA II, confounded by MSK pain. She is not volume overloaded today. - Stop losartan . - Start Entresto  24/26 mg bid. BMET/BNP today. - Continue Lasix  40 mg bid. - Continue Coreg  3.125 mg bid. - Continue spironolactone  12.5 mg daily. - Avoid SGLT2i with A1C >10 and hygiene concerns. - Repeat echo in 3 months to look for improved EF. 2. HTN: Long-standing history. BP elevated today. - Starting Entresto  as above - She is receiving BP cuff from [facility]. I asked her to check BP daily + log. - Needs sleep study (see below) 3. DM2: Uncontrolled. A1C 10.4. She is on Trulicity.  - No SGLT2i until glucose better controlled. - Management per PCP. 4. Obesity: Body mass index is 37.26 kg/m. - She has lost 100 lbs. She is on Trulicity. 5. Snoring: Suspect sleep apnea. - Arrange sleep study.  NYHA II GDMT  Diuretic: Lasix  40 mg bid BB: Coreg  3.125 mg bid Ace/ARB/ARNI: stop losartan , start Entresto  24/26 mg bid MRA: spironolactone  25 mg daily SGLT2i: n/a, concern for hygiene   Referred to HFSW (PCP, Medications, Transportation, ETOH Abuse, Drug Abuse, Insurance, Financial ): No Refer to Pharmacy: No Refer to Home Health: No Refer to Advanced Heart Failure Clinic: Yes, assign to Dr. Rolan Refer  to General Cardiology: No  Refer to AHF Clinic. Follow up in 3 weeks with APP and 3 months with Dr. Rolan + echo.  Harlene Gainer, FNP-BC 06/24/24

## 2024-06-24 NOTE — Patient Instructions (Signed)
 Medication Changes:  STOP Losartan   START Entresto  24/26 mg Twice daily   Lab Work:  Labs done today, your results will be available in MyChart, we will contact you for abnormal readings.   Testing/Procedures:  Your physician has recommended that you have a sleep study. This test records several body functions during sleep, including: brain activity, eye movement, oxygen and carbon dioxide blood levels, heart rate and rhythm, breathing rate and rhythm, the flow of air through your mouth and nose, snoring, body muscle movements, and chest and belly movement. ONCE APPROVED BY INSURANCE YOU WILL BE CALLED TO SCHEDULE  Your physician has requested that you have a cardiac MRI. Cardiac MRI uses a computer to create images of your heart as its beating, producing both still and moving pictures of your heart and major blood vessels. For further information please visit [email or web address]. Please follow the instructions below, YOU WILL BE CALLED TO SCHEDULE THIS  Your physician has requested that you have an echocardiogram. Echocardiography is a painless test that uses sound waves to create images of your heart. It provides your doctor with information about the size and shape of your heart and how well your heart's chambers and valves are working. This procedure takes approximately one hour. There are no restrictions for this procedure. Please do NOT wear cologne, perfume, aftershave, or lotions (deodorant is allowed). Please arrive 15 minutes prior to your appointment time. IN 3 MONTHS  Please note: We ask at that you not bring children with you during ultrasound (echo/ vascular) testing. Due to room size and safety concerns, children are not allowed in the ultrasound rooms during exams. Our front office staff cannot provide observation of children in our lobby area while testing is being conducted. An adult accompanying a patient to their appointment will only be allowed in the ultrasound room at the  discretion of the ultrasound technician under special circumstances. We apologize for any inconvenience.   Special Instructions // Education:  Do the following things EVERYDAY: Weigh yourself in the morning before breakfast. Write it down and keep it in a log. Take your medicines as prescribed Eat low salt foods--Limit salt (sodium) to 2000 mg per day.  Stay as active as you can everyday Limit all fluids for the day to less than 2 liters   Follow-Up in: 3 weeks and again in 3 months with an echocardiogram   At the [facility], you and your health needs are our priority. We have a designated team specialized in the treatment of Heart Failure. This Care Team includes your primary Heart Failure Specialized Cardiologist (physician), Advanced Practice Providers (APPs- Physician Assistants and Nurse Practitioners), and Pharmacist who all work together to provide you with the care you need, when you need it.   You may see any of the following providers on your designated Care Team at your next follow up:  Dr. Toribio Fuel Dr. Ezra Shuck Dr. Ria Commander Dr. Odis Brownie Greig Mosses, NP Caffie Shed, [place] [facility], [place] Beckey Coe, NP [place] Lee, NP Tinnie Redman, PharmD   Please be sure to bring in all your medications bottles to every appointment.   Need to Contact Us :  If you have any questions or concerns before your next appointment please send us  a message through [place] or call our office at [phone].    TO LEAVE A MESSAGE FOR THE NURSE SELECT OPTION 2, PLEASE LEAVE A MESSAGE INCLUDING: YOUR NAME DATE OF BIRTH CALL BACK NUMBER REASON FOR  CALL**this is important as we prioritize the call backs  YOU WILL RECEIVE A CALL BACK THE SAME DAY AS LONG AS YOU CALL BEFORE 4:00 PM

## 2024-06-24 NOTE — Telephone Encounter (Signed)
 Patient Product/process development scientist completed.    The patient is insured through [employer]. Patient has Medicare and is not eligible for a copay card, but may be able to apply for patient assistance or Medicare RX Payment Plan (Patient Must reach out to their plan, if eligible for payment plan), if available.    Ran test claim for Entresto  24-26 mg and the current 30 day co-pay is $47.00.   This test claim was processed through [facility]- copay amounts may vary at other pharmacies due to pharmacy/plan contracts, or as the patient moves through the different stages of their insurance plan.     Reyes Sharps, CPHT Pharmacy Technician III Certified Patient Advocate [facility] Patient Advocate Team Direct Number: [phone]  Fax: [phone]

## 2024-06-30 ENCOUNTER — Emergency Department (HOSPITAL_COMMUNITY)
Admission: EM | Admit: 2024-06-30 | Discharge: 2024-07-01 | Disposition: A | Discharge status: Home / Self Care | Attending: Emergency Medicine | Admitting: Emergency Medicine

## 2024-06-30 ENCOUNTER — Other Ambulatory Visit: Payer: Self-pay

## 2024-06-30 ENCOUNTER — Emergency Department (HOSPITAL_COMMUNITY)

## 2024-06-30 DIAGNOSIS — I10 Essential (primary) hypertension: Secondary | ICD-10-CM | POA: Insufficient documentation

## 2024-06-30 DIAGNOSIS — Z7984 Long term (current) use of oral hypoglycemic drugs: Secondary | ICD-10-CM | POA: Insufficient documentation

## 2024-06-30 DIAGNOSIS — N179 Acute kidney failure, unspecified: Secondary | ICD-10-CM | POA: Diagnosis not present

## 2024-06-30 DIAGNOSIS — R55 Syncope and collapse: Secondary | ICD-10-CM | POA: Insufficient documentation

## 2024-06-30 DIAGNOSIS — J45909 Unspecified asthma, uncomplicated: Secondary | ICD-10-CM | POA: Diagnosis not present

## 2024-06-30 DIAGNOSIS — I5042 Chronic combined systolic (congestive) and diastolic (congestive) heart failure: Secondary | ICD-10-CM | POA: Diagnosis not present

## 2024-06-30 DIAGNOSIS — E1165 Type 2 diabetes mellitus with hyperglycemia: Secondary | ICD-10-CM | POA: Diagnosis not present

## 2024-06-30 DIAGNOSIS — R9431 Abnormal electrocardiogram [ECG] [EKG]: Secondary | ICD-10-CM | POA: Diagnosis not present

## 2024-06-30 DIAGNOSIS — I110 Hypertensive heart disease with heart failure: Secondary | ICD-10-CM | POA: Insufficient documentation

## 2024-06-30 DIAGNOSIS — Z79899 Other long term (current) drug therapy: Secondary | ICD-10-CM | POA: Insufficient documentation

## 2024-06-30 LAB — CBC
HCT: 47.4 % — ABNORMAL HIGH (ref 36.0–46.0)
Hemoglobin: 14.8 g/dL (ref 12.0–15.0)
MCH: 27.8 pg (ref 26.0–34.0)
MCHC: 31.2 g/dL (ref 30.0–36.0)
MCV: 88.9 fL (ref 80.0–100.0)
Platelets: 352 K/uL (ref 150–400)
RBC: 5.33 MIL/uL — ABNORMAL HIGH (ref 3.87–5.11)
RDW: 13.8 % (ref 11.5–15.5)
WBC: 9.8 K/uL (ref 4.0–10.5)
nRBC: 0 % (ref 0.0–0.2)

## 2024-06-30 LAB — URINALYSIS, ROUTINE W REFLEX MICROSCOPIC
Bilirubin Urine: NEGATIVE
Glucose, UA: 50 mg/dL — AB
Ketones, ur: NEGATIVE mg/dL
Nitrite: NEGATIVE
Protein, ur: 100 mg/dL — AB
Specific Gravity, Urine: 1.013 (ref 1.005–1.030)
pH: 5 (ref 5.0–8.0)

## 2024-06-30 LAB — CBG MONITORING, ED: Glucose-Capillary: 207 mg/dL — ABNORMAL HIGH (ref 70–99)

## 2024-06-30 NOTE — ED Notes (Signed)
 Pt out in triage and states that she cannot move her left arm. Pt arm falls when this rn lifts it but pt is using arm to readjust herself in the wheelchair. Denies numbness tingling. Grip equal and moderate

## 2024-06-30 NOTE — ED Provider Triage Note (Signed)
 Emergency Medicine Provider Triage Evaluation Note  Ann Holt , a 63 y.o. female  was evaluated in triage.  Pt complains of near syncope, had asthma exacerbation and pneumonia recently. Recently started on Lasix   Review of Systems  Positive: Near syncope Negative: Fever, chills, numbness, weakness  Physical Exam  BP (!) 152/87 (BP Location: Left Arm)   Pulse 88   Temp 98.5 F (36.9 C)   Resp 16   Ht 5' 2 (1.575 m)   Wt 92.1 kg   LMP 12/26/2012   SpO2 97%   BMI 37.13 kg/m  Gen:   Awake, no distress   Resp:  Normal effort  MSK:   Moves extremities without difficulty  Other:  Equal bilateral grip strength, sensation intact  Medical Decision Making  Medically screening exam initiated at 11:13 PM.  Appropriate orders placed.  Ann Holt was informed that the remainder of the evaluation will be completed by another provider, this initial triage assessment does not replace that evaluation, and the importance of remaining in the ED until their evaluation is complete.  Labs and imaging ordered   Ann Holt 06/30/24 2316

## 2024-06-30 NOTE — ED Notes (Signed)
 Pa at bedside. Pt adjusting glasses with left hand in room

## 2024-06-30 NOTE — ED Triage Notes (Signed)
 Today you pt was at a function and the [facility] was not working. Pt began feeling dizzy and like she would pass out. No LOC. Pt feeling better after getting fan and AC. At this time no CP SOB.

## 2024-07-01 LAB — COMPREHENSIVE METABOLIC PANEL WITH GFR
ALT: 13 U/L (ref 0–44)
AST: 21 U/L (ref 15–41)
Albumin: 3.5 g/dL (ref 3.5–5.0)
Alkaline Phosphatase: 99 U/L (ref 38–126)
Anion gap: 11 (ref 5–15)
BUN: 25 mg/dL — ABNORMAL HIGH (ref 8–23)
CO2: 25 mmol/L (ref 22–32)
Calcium: 8.7 mg/dL — ABNORMAL LOW (ref 8.9–10.3)
Chloride: 105 mmol/L (ref 98–111)
Creatinine, Ser: 1.41 mg/dL — ABNORMAL HIGH (ref 0.44–1.00)
GFR, Estimated: 42 mL/min — ABNORMAL LOW (ref >60)
Glucose, Bld: 222 mg/dL — ABNORMAL HIGH (ref 70–99)
Potassium: 4.3 mmol/L (ref 3.5–5.1)
Sodium: 141 mmol/L (ref 135–145)
Total Bilirubin: 1.3 mg/dL — ABNORMAL HIGH (ref 0.0–1.2)
Total Protein: 7.6 g/dL (ref 6.5–8.1)

## 2024-07-01 LAB — BRAIN NATRIURETIC PEPTIDE: B Natriuretic Peptide: 89.1 pg/mL (ref 0.0–100.0)

## 2024-07-01 NOTE — ED Provider Notes (Signed)
 [place] EMERGENCY DEPARTMENT AT [facility] Provider Note  CSN: [id number] Arrival date & time: 06/30/24 2044  Chief Complaint(s) Near Syncope  HPI Ann Holt is a 63 y.o. female with a past medical history listed below who presents to the emergency department for lightheadedness.  Patient reports that she was visiting a family member whose AC is not functioning.  Reports that the residence this evening was hot and they were using the oven which made the environment warmer.  Patient reports being hit by heat wave which caused her to feel lightheaded.  She denies actually losing consciousness.  This improved after family members got a fan to blow onto her face.  Patient denied any associated headache, vision changes, chest pain, shortness of breath, abdominal pain.  She denies any recent nausea or vomiting.  No diarrhea.  Patient was admitted earlier in the month for CHF exacerbation and has been taking her Lasix  and spironolactone  as directed.  The history is provided by the patient.    Past Medical History Past Medical History:  Diagnosis Date   Asthma    Diabetes mellitus without complication (HCC)    Hypertension    Patient Active Problem List   Diagnosis Date Noted   Chronic systolic heart failure (HCC) 06/24/2024   Heart failure (HCC) 06/18/2024   Asthma, chronic 06/18/2024   Hypokalemia 06/18/2024   Acute on chronic diastolic CHF (congestive heart failure) (HCC) 06/17/2024   Essential hypertension 06/17/2024   Acute pulmonary edema (HCC) 06/17/2024   Mild intermittent asthma with exacerbation 06/17/2024   Benign hypertension 06/17/2024   Mixed hyperlipidemia 06/17/2024   Type 2 diabetes mellitus with hyperlipidemia (HCC) 06/17/2024   Obesity, class 2 06/17/2024   Home Medication(s) Prior to Admission medications   Medication Sig Start Date End Date Taking? Authorizing Provider  albuterol  (PROVENTIL  HFA;VENTOLIN  HFA) 108 (90 BASE) MCG/ACT inhaler Inhale 2  puffs into the lungs every 4 (four) hours as needed for wheezing or shortness of breath. For shortness of breath.    [provider]  Blood Glucose Monitoring Suppl DEVI 1 each by Does not apply route in the morning, at noon, and at bedtime. May substitute to any manufacturer covered by patient's insurance. 06/20/24   Arrien, Mauricio Daniel, MD  carvedilol  (COREG ) 3.125 MG tablet Take 1 tablet (3.125 mg total) by mouth 2 (two) times daily with a meal. 06/24/24 07/24/24  Milford, [name], FNP  Dulaglutide 1.5 MG/0.5ML SOAJ Inject 1.5 mg into the skin once a week. Inject 1.5mg  subcutaneously weekly on Sunday. 02/18/23   [provider]  ENTRESTO  24-26 MG Take 1 tablet by mouth 2 (two) times daily. 06/24/24   Milford, [name], FNP  furosemide  (LASIX ) 40 MG tablet Take 1 tablet (40 mg total) by mouth 2 (two) times daily. 06/24/24   Milford, [name], FNP  glipiZIDE  (GLUCOTROL  XL) 10 MG 24 hr tablet Take 10 mg by mouth. 02/18/23   [provider]  Glucose Blood (BLOOD GLUCOSE TEST STRIPS) STRP 1 each by In Vitro route in the morning, at noon, and at bedtime. May substitute to any manufacturer covered by patient's insurance. 06/20/24 07/20/24  Arrien, Elidia Sieving, MD  Lancet Device MISC 1 each by Does not apply route in the morning, at noon, and at bedtime. May substitute to any manufacturer covered by patient's insurance. 06/20/24 07/20/24  Arrien, Elidia Sieving, MD  Lancets Misc. MISC 1 each by Does not apply route in the morning, at noon, and at bedtime. May substitute  to any manufacturer covered by patient's insurance. 06/20/24 07/20/24  Arrien, Elidia Sieving, MD  loratadine  (CLARITIN ) 10 MG tablet Take 10 mg by mouth daily.    [provider]  spironolactone  (ALDACTONE ) 25 MG tablet Take 0.5 tablets (12.5 mg total) by mouth daily. 06/24/24   Milford, [name], FNP  traMADol (ULTRAM) 50 MG tablet Take 50 mg by mouth daily as needed for moderate pain (pain score 4-6).  06/05/20   [provider]                                                                                                                                    Allergies Penicillins  Review of Systems Review of Systems  Cardiovascular:  Positive for near-syncope.   As noted in HPI  Physical Exam Vital Signs  I have reviewed the triage vital signs BP (!) 152/87 (BP Location: Left Arm)   Pulse 88   Temp 98.5 F (36.9 C)   Resp 16   Ht 5' 2 (1.575 m)   Wt 92.1 kg   LMP 12/26/2012   SpO2 97%   BMI 37.13 kg/m   Physical Exam Vitals reviewed.  Constitutional:      General: She is not in acute distress.    Appearance: She is well-developed. She is not diaphoretic.  HENT:     Head: Normocephalic and atraumatic.     Nose: Nose normal.  Eyes:     General: No scleral icterus.       Right eye: No discharge.        Left eye: No discharge.     Conjunctiva/sclera: Conjunctivae normal.     Pupils: Pupils are equal, round, and reactive to light.  Cardiovascular:     Rate and Rhythm: Normal rate and regular rhythm.     Heart sounds: No murmur heard.    No friction rub. No gallop.  Pulmonary:     Effort: Pulmonary effort is normal. No respiratory distress.     Breath sounds: Normal breath sounds. No stridor. No rales.  Abdominal:     General: There is no distension.     Palpations: Abdomen is soft.     Tenderness: There is no abdominal tenderness.  Musculoskeletal:        General: No tenderness.     Cervical back: Normal range of motion and neck supple.  Skin:    General: Skin is warm and dry.     Findings: No erythema or rash.  Neurological:     Mental Status: She is alert and oriented to person, place, and time.     ED Results and Treatments Labs (all labs ordered are listed, but only abnormal results are displayed) Labs Reviewed  COMPREHENSIVE METABOLIC PANEL WITH GFR - Abnormal; Notable for the following components:      Result Value   Glucose, Bld 222 (*)     BUN 25 (*)  Creatinine, Ser 1.41 (*)    Calcium 8.7 (*)    Total Bilirubin 1.3 (*)    GFR, Estimated 42 (*)    All other components within normal limits  CBC - Abnormal; Notable for the following components:   RBC 5.33 (*)    HCT 47.4 (*)    All other components within normal limits  URINALYSIS, ROUTINE W REFLEX MICROSCOPIC - Abnormal; Notable for the following components:   APPearance CLOUDY (*)    Glucose, UA 50 (*)    Hgb urine dipstick SMALL (*)    Protein, ur 100 (*)    Leukocytes,Ua TRACE (*)    Bacteria, UA MANY (*)    All other components within normal limits  CBG MONITORING, ED - Abnormal; Notable for the following components:   Glucose-Capillary 207 (*)    All other components within normal limits  BRAIN NATRIURETIC PEPTIDE                                                                                                                         EKG  EKG Interpretation Date/Time:  Thursday June 30 2024 21:07:28 EDT Ventricular Rate:  89 PR Interval:  142 QRS Duration:  76 QT Interval:  386 QTC Calculation: 469 R Axis:   27  Text Interpretation: Normal sinus rhythm Right atrial enlargement Anterior infarct , age undetermined Abnormal ECG When compared with ECG of 24-Jun-2024 10:55, PREVIOUS ECG IS PRESENT No significant change was found Confirmed by Trine Likes [phone]) on 07/01/2024 1:11:44 AM       Radiology DG Chest 2 View Result Date: 06/30/2024 CLINICAL DATA:  Near-syncope EXAM: CHEST - 2 VIEW COMPARISON:  06/16/2024 and 06/17/2024 FINDINGS: Stable cardiomegaly. No focal consolidation, pleural effusion, or pneumothorax. No displaced rib fractures. IMPRESSION: No acute cardiopulmonary disease. Cardiomegaly. Electronically Signed   By: Norman Gatlin M.D.   On: 06/30/2024 23:34    Medications Ordered in ED Medications - No data to display Procedures Procedures  (including critical care time) Medical Decision Making / ED Course   Medical Decision  Making Amount and/or Complexity of Data Reviewed Labs: ordered. Decision-making details documented in ED Course. Radiology: ordered and independent interpretation performed. Decision-making details documented in ED Course. ECG/medicine tests: ordered and independent interpretation performed. Decision-making details documented in ED Course.    Near syncope in the setting of heat exposure.  Differential diagnosis considered.  Patient denied any chest pain or shortness of breath concerning for primary cardiac etiology.  Her EKG did not show any signs of acute ischemic changes, dysrhythmias or blocks.  Chest x-ray without evidence of pneumonia, pneumothorax, pulmonary edema or pleural effusions.  BNP normal.  Not concerning for CHF exacerbation.  CBC without leukocytosis or anemia Metabolic panel without significant electrolyte derangements.  She does have hyperglycemia without DKA. Patient has mild AKI with a creatinine of 1.4.  Likely from diuresing.  In triage process, UA was ordered.  It appears to be contaminated.  Patient is not having  any urinary symptoms concerning for UTI.  Patient has been up and about 1 in the emergency department and asymptomatic.  Diuretic medication adjustment were recommended.  Patient reports having close follow-up with her PCP next week already scheduled.  Recommend she have her renal function rechecked during that visit.    Final Clinical Impression(s) / ED Diagnoses Final diagnoses:  Near syncope  AKI (acute kidney injury) (HCC)   The patient appears reasonably screened and/or stabilized for discharge and I doubt any other medical condition or other [facility] requiring further screening, evaluation, or treatment in the ED at this time. I have discussed the findings, Dx and Tx plan with the patient/family who expressed understanding and agree(s) with the plan. Discharge instructions discussed at length. The patient/family was given strict return precautions who  verbalized understanding of the instructions. No further questions at time of discharge.  Disposition: Discharge  Condition: Good  ED Discharge Orders     None        Follow Up: Pura Lenis, MD [address] [place] [address] [phone]  Go to  as scheduled    This chart was dictated using voice recognition software.  Despite best efforts to proofread,  errors can occur which can change the documentation meaning.    Trine Raynell Moder, MD 07/01/24 [phone]

## 2024-07-01 NOTE — Discharge Instructions (Addendum)
 Thank you for allowing us  to take care of you today.  We hope you begin feeling better soon.  To-Do: Please follow-up with your primary doctor as scheduled. As we discussed, please decrease your Lasix  dose to 40 mg once daily for the next 3 days (Friday, Saturday, Sunday).  On Monday you can restart 40 mg in the morning and 20 mg in the evening.  Please ask your primary care provider to recheck your kidney function during your visit next week. Please return to the Emergency Department or call 911 if you experience chest pain, shortness of breath, severe pain, severe fever, altered mental status, or have any reason to think that you need emergency medical care.  Thank you again.  Hope you feel better soon.  Department of Emergency Medicine [facility]

## 2024-07-05 ENCOUNTER — Inpatient Hospital Stay (HOSPITAL_BASED_OUTPATIENT_CLINIC_OR_DEPARTMENT_OTHER)

## 2024-07-05 ENCOUNTER — Encounter (HOSPITAL_BASED_OUTPATIENT_CLINIC_OR_DEPARTMENT_OTHER): Payer: Self-pay

## 2024-07-15 ENCOUNTER — Encounter (HOSPITAL_COMMUNITY)

## 2024-07-19 ENCOUNTER — Telehealth (HOSPITAL_COMMUNITY): Payer: Self-pay

## 2024-07-19 NOTE — Telephone Encounter (Signed)
 Called to confirm/remind patient of their appointment at the [facility] on 07/20/24.   Appointment:   [] Confirmed  [] Left mess   [x] No answer/No voice mail  [] VM Full/unable to leave message  [] Phone not in service

## 2024-07-20 ENCOUNTER — Encounter (HOSPITAL_COMMUNITY)

## 2024-07-20 NOTE — Progress Notes (Incomplete)
 [facility] CONSULT NOTE   Primary Care: Pura Lenis, MD Primary Cardiologist: Madonna Large, DO HF Cardiologist: Dr. Rolan  HPI: Ann Holt is a 63 y.o. female with a hx of DM2, asthma, HTN, obesity, and newly diagnosed HFmrEF.    Admitted 8/25 with hypertensive urgency, and found to have new systolic heart failure. BP in ED 192/125. Echo showed EF 40-45%, diffuse LVHK, G1DD, RV moderately reduced. Underwent R/LHC showing normal cors, well-compensated hemodynamics after diuresis. 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 GDMT titrated and she was discharged home, weight 205 lbs.   Initial TOC appt. Today she presents to [facility] for post hospital HF follow up with her daughter. Overall feeling fine. She lives alone, she is not SOB with ADLs. She walks with a cane due to knee pain. Denies palpitations, abnormal bleeding, CP, dizziness, edema, or PND/Orthopnea. Appetite ok. Weight at home 203 pounds. Taking all medications. She snores. Has bilateral carpal tunnel. Retired from post office. Lost 100 lbs in past 4 years.  Today she returns for HF follow up. Overall feeling fine. Denies increasing SOB, palpitations, abnormal bleeding, CP, dizziness, edema, or PND/Orthopnea. Appetite ok. Weight at home 170 pounds. Taking all medications.     ECG (personally reviewed): none ordered today   Labs (8/25): K 4.3, creatinine 1.41   Cardiac Testing  - R/LHC (8/25): normal cors, RA 5, PA 39/15 (25), PCWP 8, CO/CI (Fick) 4.76/2.46, PAPi 4.8 - Echo (8/25): EF 40-45%, mild LVH, G1DD, RV moderately reduced.   Family Hx: mother with HTN and CHF, father with HTN Social Hx: no ETOH, drug or tobacco use   Past Medical History:  Diagnosis Date   Asthma    Diabetes mellitus without complication (HCC)    Hypertension    Current Outpatient Medications  Medication Sig Dispense Refill   albuterol  (PROVENTIL  HFA;VENTOLIN  HFA) 108 (90 BASE) MCG/ACT inhaler Inhale 2 puffs into the lungs every 4 (four) hours as needed for  wheezing or shortness of breath. For shortness of breath.     Blood Glucose Monitoring Suppl DEVI 1 each by Does not apply route in the morning, at noon, and at bedtime. May substitute to any manufacturer covered by patient's insurance. 1 each 0   carvedilol  (COREG ) 3.125 MG tablet Take 1 tablet (3.125 mg total) by mouth 2 (two) times daily with a meal. 60 tablet 6   Dulaglutide 1.5 MG/0.5ML SOAJ Inject 1.5 mg into the skin once a week. Inject 1.5mg  subcutaneously weekly on Sunday.     ENTRESTO  24-26 MG Take 1 tablet by mouth 2 (two) times daily. 60 tablet 6   furosemide  (LASIX ) 40 MG tablet Take 1 tablet (40 mg total) by mouth 2 (two) times daily. 60 tablet 6   glipiZIDE  (GLUCOTROL  XL) 10 MG 24 hr tablet Take 10 mg by mouth.     Glucose Blood (BLOOD GLUCOSE TEST STRIPS) STRP 1 each by In Vitro route in the morning, at noon, and at bedtime. May substitute to any manufacturer covered by patient's insurance. 90 each 0   Lancet Device MISC 1 each by Does not apply route in the morning, at noon, and at bedtime. May substitute to any manufacturer covered by patient's insurance. 1 each 0   Lancets Misc. MISC 1 each by Does not apply route in the morning, at noon, and at bedtime. May substitute to any manufacturer covered by patient's insurance. 100 each 0   loratadine  (CLARITIN ) 10 MG tablet Take 10 mg by mouth daily.     spironolactone  (ALDACTONE ) 25  MG tablet Take 0.5 tablets (12.5 mg total) by mouth daily. 15 tablet 6   traMADol (ULTRAM) 50 MG tablet Take 50 mg by mouth daily as needed for moderate pain (pain score 4-6).     No current facility-administered medications for this visit.   Allergies  Allergen Reactions   Penicillins Hives   Social History   Socioeconomic History   Marital status: Single    Spouse name: Not on file   Number of children: 1   Years of education: Not on file   Highest education level: High school graduate  Occupational History   Occupation: Retired  Tobacco Use    Smoking status: Never   Smokeless tobacco: Not on file  Vaping Use   Vaping status: Never Used  Substance and Sexual Activity   Alcohol use: No   Drug use: No   Sexual activity: Not Currently    Birth control/protection: None  Other Topics Concern   Not on file  Social History Narrative   Not on file   Social Drivers of Health   Financial Resource Strain: Low Risk  (07/06/2024)   Received from [facility]   Overall Financial Resource Strain (CARDIA)    How hard is it for you to pay for the very basics like food, housing, medical care, and heating?: Not hard at all  Food Insecurity: No Food Insecurity (07/06/2024)   Received from [facility]   Hunger Vital Sign    Within the past 12 months, you worried that your food would run out before you got the money to buy more.: Never true    Within the past 12 months, the food you bought just didn't last and you didn't have money to get more.: Never true  Transportation Needs: No Transportation Needs (07/06/2024)   Received from [facility] - Transportation    In the past 12 months, has lack of transportation kept you from medical appointments or from getting medications?: No    In the past 12 months, has lack of transportation kept you from meetings, work, or from getting things needed for daily living?: No  Physical Activity: Inactive (07/06/2024)   Received from [facility]   Exercise Vital Sign    On average, how many days per week do you engage in moderate to strenuous exercise (like a brisk walk)?: 0 days    Minutes of Exercise per Session: Not on file  Stress: No Stress Concern Present (07/06/2024)   Received from [facility] - Occupational Stress Questionnaire    Do you feel stress - tense, restless, nervous, or anxious, or unable to sleep at night because your mind is troubled all the time - these days?: Not at all  Social Connections: Socially Integrated (07/06/2024)    Received from [facility]   Social Network    How would you rate your social network (family, work, friends)?: Good participation with social networks  Intimate Partner Violence: Not At Risk (07/06/2024)   Received from [facility]   HITS    Over the last 12 months how often did your partner physically hurt you?: Never    Over the last 12 months how often did your partner insult you or talk down to you?: Never    Over the last 12 months how often did your partner threaten you with physical harm?: Never    Over the last 12 months how often did your partner scream or curse at you?:  Never     No family history on file.  There were no vitals filed for this visit.  PHYSICAL EXAM: General:  NAD. No resp difficulty HEENT: Normal Neck: Supple. No JVD. Cor: Regular rate & rhythm. No rubs, gallops or murmurs. Lungs: Clear Abdomen: Soft, nontender, nondistended.  Extremities: No cyanosis, clubbing, rash, edema Neuro: Alert & oriented x 3, moves all 4 extremities w/o difficulty. Affect pleasant.  ASSESSMENT & PLAN: 1. HFmrEF: Newly diagnosed. Echo 8/25 showed EF 40-45%. R/LHC showed no CAD, well-compensated filling pressures. 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 Does have family history. No recent viral-type illnesses. With LVH and carpal tunnel, consider amyloidosis. Will arrange CMRI first to evaluate for evidence of infiltrative/inflammatory disease. NYHA II, confounded by MSK pain. She is not volume overloaded today. - Stop losartan . - Start Entresto  24/26 mg bid. BMET/BNP today. - Continue Lasix  40 mg bid. - Continue Coreg  3.125 mg bid. - Continue spironolactone  12.5 mg daily. - Avoid SGLT2i with A1C >10 and hygiene concerns. - Repeat echo in 3 months to look for improved EF. 2. HTN: Long-standing history. BP elevated today. - Starting Entresto  as above - She is receiving BP cuff from [facility]. I asked her to check BP daily + log. - Needs sleep study (see below) 3. DM2:  Uncontrolled. A1C 10.4. She is on Trulicity.  - No SGLT2i until glucose better controlled. - Management per PCP. 4. Obesity: Body mass index is 37.26 kg/m. - She has lost 100 lbs. She is on Trulicity. 5. Snoring: Suspect sleep apnea. - Arrange sleep study.   Follow up in 3 months with Dr. Rolan + echo.   Harlene Gainer, FNP-BC 06/24/24

## 2024-09-13 ENCOUNTER — Telehealth (HOSPITAL_COMMUNITY): Payer: Self-pay | Admitting: Cardiology

## 2024-09-13 NOTE — Telephone Encounter (Signed)
 Called to confirm/remind patient of their appointment at the [facility] on 09/13/24.   Appointment:   [] Confirmed  [] Left mess   [x] No answer/No voice mail  [] VM Full/unable to leave message  [] Phone not in service  Patient reminded to bring all medications and/or complete list.  Confirmed patient has transportation. Gave directions, instructed to utilize valet parking.

## 2024-09-14 ENCOUNTER — Ambulatory Visit (HOSPITAL_COMMUNITY): Payer: Self-pay | Admitting: Cardiology

## 2024-09-14 ENCOUNTER — Telehealth (HOSPITAL_COMMUNITY): Payer: Self-pay

## 2024-09-14 ENCOUNTER — Other Ambulatory Visit (HOSPITAL_COMMUNITY): Payer: Self-pay

## 2024-09-14 ENCOUNTER — Ambulatory Visit (HOSPITAL_BASED_OUTPATIENT_CLINIC_OR_DEPARTMENT_OTHER)
Admission: RE | Admit: 2024-09-14 | Discharge: 2024-09-14 | Disposition: A | Discharge status: Home / Self Care | Source: Ambulatory Visit | Attending: Cardiology | Admitting: Cardiology

## 2024-09-14 ENCOUNTER — Ambulatory Visit (HOSPITAL_COMMUNITY)
Admission: RE | Admit: 2024-09-14 | Discharge: 2024-09-14 | Disposition: A | Discharge status: Home / Self Care | Source: Ambulatory Visit | Attending: Family Medicine | Admitting: Family Medicine

## 2024-09-14 VITALS — BP 160/90 | HR 101 | Wt 217.6 lb

## 2024-09-14 DIAGNOSIS — R9431 Abnormal electrocardiogram [ECG] [EKG]: Secondary | ICD-10-CM | POA: Diagnosis not present

## 2024-09-14 DIAGNOSIS — Z8249 Family history of ischemic heart disease and other diseases of the circulatory system: Secondary | ICD-10-CM | POA: Diagnosis not present

## 2024-09-14 DIAGNOSIS — Z79899 Other long term (current) drug therapy: Secondary | ICD-10-CM | POA: Insufficient documentation

## 2024-09-14 DIAGNOSIS — Z7984 Long term (current) use of oral hypoglycemic drugs: Secondary | ICD-10-CM | POA: Insufficient documentation

## 2024-09-14 DIAGNOSIS — E119 Type 2 diabetes mellitus without complications: Secondary | ICD-10-CM | POA: Insufficient documentation

## 2024-09-14 DIAGNOSIS — G5600 Carpal tunnel syndrome, unspecified upper limb: Secondary | ICD-10-CM | POA: Diagnosis not present

## 2024-09-14 DIAGNOSIS — J45909 Unspecified asthma, uncomplicated: Secondary | ICD-10-CM | POA: Insufficient documentation

## 2024-09-14 DIAGNOSIS — I428 Other cardiomyopathies: Secondary | ICD-10-CM | POA: Insufficient documentation

## 2024-09-14 DIAGNOSIS — I110 Hypertensive heart disease with heart failure: Secondary | ICD-10-CM | POA: Insufficient documentation

## 2024-09-14 DIAGNOSIS — E785 Hyperlipidemia, unspecified: Secondary | ICD-10-CM | POA: Diagnosis not present

## 2024-09-14 DIAGNOSIS — Z6839 Body mass index (BMI) 39.0-39.9, adult: Secondary | ICD-10-CM | POA: Insufficient documentation

## 2024-09-14 DIAGNOSIS — I5020 Unspecified systolic (congestive) heart failure: Secondary | ICD-10-CM | POA: Diagnosis not present

## 2024-09-14 DIAGNOSIS — I358 Other nonrheumatic aortic valve disorders: Secondary | ICD-10-CM | POA: Diagnosis not present

## 2024-09-14 DIAGNOSIS — E669 Obesity, unspecified: Secondary | ICD-10-CM | POA: Diagnosis not present

## 2024-09-14 DIAGNOSIS — I5022 Chronic systolic (congestive) heart failure: Secondary | ICD-10-CM | POA: Diagnosis not present

## 2024-09-14 DIAGNOSIS — R0683 Snoring: Secondary | ICD-10-CM | POA: Diagnosis not present

## 2024-09-14 DIAGNOSIS — Z7985 Long-term (current) use of injectable non-insulin antidiabetic drugs: Secondary | ICD-10-CM | POA: Insufficient documentation

## 2024-09-14 LAB — ECHOCARDIOGRAM COMPLETE
S' Lateral: 3.6 cm
Single Plane A2C EF: 40.4 %

## 2024-09-14 LAB — CBC
HCT: 41.5 % (ref 36.0–46.0)
Hemoglobin: 13.8 g/dL (ref 12.0–15.0)
MCH: 29.1 pg (ref 26.0–34.0)
MCHC: 33.3 g/dL (ref 30.0–36.0)
MCV: 87.6 fL (ref 80.0–100.0)
Platelets: 309 K/uL (ref 150–400)
RBC: 4.74 MIL/uL (ref 3.87–5.11)
RDW: 14.8 % (ref 11.5–15.5)
WBC: 8.2 K/uL (ref 4.0–10.5)
nRBC: 0 % (ref 0.0–0.2)

## 2024-09-14 LAB — BRAIN NATRIURETIC PEPTIDE: B Natriuretic Peptide: 45.6 pg/mL (ref 0.0–100.0)

## 2024-09-14 MED ORDER — ENTRESTO 49-51 MG PO TABS: 1.0000 | ORAL_TABLET | Freq: Two times a day (BID) | ORAL | 11 refills | Status: DC

## 2024-09-14 MED ORDER — FARXIGA 10 MG PO TABS
10.0000 mg | ORAL_TABLET | Freq: Every day | ORAL | 3 refills | Status: AC
Start: 2024-09-14 — End: ?

## 2024-09-14 MED ORDER — SPIRONOLACTONE 25 MG PO TABS: 12.5000 mg | ORAL_TABLET | Freq: Every day | ORAL | 3 refills | Status: AC

## 2024-09-14 NOTE — Progress Notes (Signed)
 PCP: Pura Lenis, MD  HF Cardiology: Dr Rolan  Chief complaint: CHF  HPI: Referred to HF MD clinic from [facility] clinic for evaluation of CHF.   Ann Holt is a 63 y.o. female with a hx of DM2, asthma, HTN, obesity, and HFmrEF/nonischemic cardiomyopathy.   Admitted 8/25 with hypertensive urgency and found to have new systolic heart failure. BP in ED 192/125. Echo showed EF 40-45%, diffuse hypokinesis, RV moderately reduced systolic function. Underwent R/LHC showing normal coronaries, well-compensated hemodynamics after diuresis. 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 GDMT titrated and she was discharged home, weight 205 lbs.  Echo was done today and reviewed, EF 45%, mild RV dysfunction, normal IVC.   Patient has been out of spironolactone  and has not taken Lasix  since last Sunday. She had already cut her Lasix  back to 40 mg once daily.  She is short of breath walking long distances.  No chest pain.  No orthopnea/PND.  No lightheadedness or palpitations.  She snores and is sleepy at times during the day.  BP is elevated today.   ECG (personally reviewed): NSR, lateral TWIs  Labs (8/25): K 4.0, creatinine 1.0 => 1.4, BNP 89, hgb 14.8  PMH: 1. CKD stage 3: Likely related to HTN.  2. HTN 3. Type 2 diabetes 4. Asthma 5. Chronic HF with mid range EF:  Nonischemic cardiomyopathy.  - R/LHC (8/25): normal coronaries; RA 5, PA 39/15 (25), PCWP 8, CO/CI (Fick) 4.76/2.46, PAPi 4.8 - Echo (8/25): EF 40-45%, mild LVH, RV moderately reduced. - Echo (11/25): EF 45%, mild LVH, mild RV dysfunction, normal IVC. 6. Bilateral carpal tunnel syndrome.   FH: HTN, no CHF.  SH: Retired from post office, 1 daughter, nonsmoker, no ETOH.   ROS: All systems reviewed and negative except as per HPI.    Current Outpatient Medications  Medication Sig Dispense Refill   albuterol  (PROVENTIL  HFA;VENTOLIN  HFA) 108 (90 BASE) MCG/ACT inhaler Inhale 2 puffs into the lungs every 4 (four) hours as  needed for wheezing or shortness of breath. For shortness of breath.     Blood Glucose Monitoring Suppl DEVI 1 each by Does not apply route in the morning, at noon, and at bedtime. May substitute to any manufacturer covered by patient's insurance. 1 each 0   carvedilol  (COREG ) 3.125 MG tablet Take 1 tablet (3.125 mg total) by mouth 2 (two) times daily with a meal. 60 tablet 6   Dulaglutide 1.5 MG/0.5ML SOAJ Inject 1.5 mg into the skin once a week. Inject 1.5mg  subcutaneously weekly on Sunday.     ENTRESTO  49-51 MG Take 1 tablet by mouth 2 (two) times daily. 60 tablet 11   FARXIGA 10 MG TABS tablet Take 1 tablet (10 mg total) by mouth daily before breakfast. 90 tablet 3   glipiZIDE  (GLUCOTROL  XL) 10 MG 24 hr tablet Take 10 mg by mouth.     loratadine  (CLARITIN ) 10 MG tablet Take 10 mg by mouth daily.     traMADol (ULTRAM) 50 MG tablet Take 50 mg by mouth daily as needed for moderate pain (pain score 4-6).     spironolactone  (ALDACTONE ) 25 MG tablet Take 0.5 tablets (12.5 mg total) by mouth daily. 45 tablet 3   No current facility-administered medications for this encounter.   Allergies  Allergen Reactions   Penicillins Hives    Wt Readings from Last 3 Encounters:  09/14/24 98.7 kg (217 lb 9.6 oz)  06/30/24 92.1 kg (203 lb)  06/24/24 92.4 kg (203 lb 11.2 oz)   BP ROLLEN)  160/90   Pulse (!) 101   Wt 98.7 kg (217 lb 9.6 oz)   LMP 12/26/2012   SpO2 100%   BMI 39.80 kg/m   PHYSICAL EXAM: General: NAD Neck: No JVD, no thyromegaly or thyroid nodule.  Lungs: Clear to auscultation bilaterally with normal respiratory effort. CV: Nondisplaced PMI.  Heart regular S1/S2, no S3/S4, no murmur.  No peripheral edema.  No carotid bruit.  Normal pedal pulses.  Abdomen: Soft, nontender, no hepatosplenomegaly, no distention.  Skin: Intact without lesions or rashes.  Neurologic: Alert and oriented x 3.  Psych: Normal affect. Extremities: No clubbing or cyanosis.  HEENT: Normal.   ASSESSMENT &  PLAN: 1. HFmrEF: Nonischemic cardiomyopathy. Echo 8/25 showed EF 40-45%, moderate RV dysfunction. R/LHC (8/25) showed no CAD, well-compensated filling pressures. Long history of HTN, possible hypertensive cardiomyopathy.  Echo today showed EF 45%, mild LVH, mild RV dysfunction, normal IVC.  With LVH and carpal tunnel syndrome, consider amyloidosis. She is not volume overloaded on exam today.  NYHA class II symptoms.  - I think she can stop Lasix .   - Increase Entresto  to 49/51 mg bid.  - Continue Coreg  3.125 mg bid. - Continue spironolactone  12.5 mg daily. - Start Farxiga 10 mg daily.  BMET/BNP today, BMET in 10 days.  - Restart spironolactone  12.5 daily. - I will arrange for cardiac MRI to assess for amyloidosis or other infiltrative disease.  2. HTN: Long-standing history. BP elevated today. - Increasing Entresto  as above.  - Restarting spironolactone .  - Will need sleep study.  - Renal artery dopplers to screen for renal artery stenosis.  3. Obesity: Body mass index is 39.8 kg/m. She has lost 100 lbs. She is on Trulicity. 4. Snoring: Suspect sleep apnea. - Arrange sleep study.  Followup in 3 wks with HF pharmacist for med tiration, see APP in 6 wks.   I spent 57 minutes reviewing records, interviewing/examining patient, and managing orders.   Ann Holt 09/14/24

## 2024-09-14 NOTE — Telephone Encounter (Signed)
 Advanced Heart Failure Patient Advocate Encounter  The patient was approved for a [employer] grant that will help cover the cost of Carvedilol , Entresto , Farxiga, Spironolactone .  Total amount awarded, $7,500.  Effective: 08/15/2024 - 08/14/2025.  BIN [id number] PCN [id number] Group [id number] ID [id number]  Pharmacy provided with approval and processing information. Confirmed $0 copay for Entresto . Patient informed while in office.  Rachel DEL, CPhT Rx Patient Advocate Phone: [phone]

## 2024-09-14 NOTE — Progress Notes (Signed)
  Echocardiogram 2D Echocardiogram has been performed.  Ann Holt 09/14/2024, 11:14 AM

## 2024-09-14 NOTE — Patient Instructions (Addendum)
 START Farxiga 10 mg daily.  CHANGE Entresto  to 49/51 mg Twice daily  Labs done today, your results will be available in MyChart, we will contact you for abnormal readings.  REPEAT Blood work in 10 days.  Your physician has requested that you have a cardiac MRI. Cardiac MRI uses a computer to create images of your heart as its beating, producing both still and moving pictures of your heart and major blood vessels. For further information please visit [email or web address]. Please follow the instruction sheet given to you today for more information.  Your provider has ordered renal artery dopplers for you. You will be called to have this test arranged.  You have been referred to pulmonology for a sleep study test. You will be called to have this test arranged.  Please follow up with our heart failure pharmacist as scheduled.  Your physician recommends that you schedule a follow-up appointment in: 6 weeks.  If you have any questions or concerns before your next appointment please send us  a message through [place] or call our office at [phone].    TO LEAVE A MESSAGE FOR THE NURSE SELECT OPTION 2, PLEASE LEAVE A MESSAGE INCLUDING: YOUR NAME DATE OF BIRTH CALL BACK NUMBER REASON FOR CALL**this is important as we prioritize the call backs  YOU WILL RECEIVE A CALL BACK THE SAME DAY AS LONG AS YOU CALL BEFORE 4:00 PM  At the Advanced Heart Failure Clinic, you and your health needs are our priority. As part of our continuing mission to provide you with exceptional heart care, we have created designated Provider Care Teams. These Care Teams include your primary Cardiologist (physician) and Advanced Practice Providers (APPs- Physician Assistants and Nurse Practitioners) who all work together to provide you with the care you need, when you need it.   You may see any of the following providers on your designated Care Team at your next follow up: Dr Toribio Fuel Dr Ezra Shuck Dr. Morene Brownie Greig Mosses, NP Caffie Shed, [place] [facility] [place], [place] Beckey Coe, NP Jordan Lee, NP Ellouise Class, NP Tinnie Redman, PharmD Jaun Bash, PharmD   Please be sure to bring in all your medications bottles to every appointment.    Thank you for choosing [facility]

## 2024-09-26 ENCOUNTER — Ambulatory Visit (HOSPITAL_COMMUNITY)
Admission: RE | Admit: 2024-09-26 | Discharge: 2024-09-26 | Disposition: A | Discharge status: Home / Self Care | Source: Ambulatory Visit | Attending: Cardiology | Admitting: Cardiology

## 2024-09-26 DIAGNOSIS — I5022 Chronic systolic (congestive) heart failure: Secondary | ICD-10-CM | POA: Insufficient documentation

## 2024-09-26 LAB — BASIC METABOLIC PANEL WITH GFR
Anion gap: 11 (ref 5–15)
BUN: 26 mg/dL — ABNORMAL HIGH (ref 8–23)
CO2: 24 mmol/L (ref 22–32)
Calcium: 8.7 mg/dL — ABNORMAL LOW (ref 8.9–10.3)
Chloride: 105 mmol/L (ref 98–111)
Creatinine, Ser: 1.03 mg/dL — ABNORMAL HIGH (ref 0.44–1.00)
GFR, Estimated: >60 mL/min (ref >60)
Glucose, Bld: 171 mg/dL — ABNORMAL HIGH (ref 70–99)
Potassium: 4.9 mmol/L (ref 3.5–5.1)
Sodium: 140 mmol/L (ref 135–145)

## 2024-10-07 ENCOUNTER — Ambulatory Visit (HOSPITAL_COMMUNITY)

## 2024-10-11 ENCOUNTER — Ambulatory Visit (HOSPITAL_COMMUNITY): Admission: RE | Admit: 2024-10-11 | Source: Ambulatory Visit

## 2024-10-18 ENCOUNTER — Telehealth (HOSPITAL_COMMUNITY): Payer: Self-pay

## 2024-10-18 NOTE — Telephone Encounter (Signed)
 Called to confirm/remind patient of their appointment at the Advanced Heart Failure Clinic on 10/18/2024.   Appointment:   [x] Confirmed  [] Left mess   [] No answer/No voice mail  [] VM Full/unable to leave message  [] Phone not in service  Patient reminded to bring all medications and/or complete list.  Confirmed patient has transportation. Gave directions, instructed to utilize valet parking.

## 2024-10-18 NOTE — Progress Notes (Incomplete)
 PCP: Pura Lenis, MD  HF Cardiology: Dr Rolan  Chief complaint: CHF  HPI: Referred to HF MD clinic from [facility] clinic for evaluation of CHF.   Ann Holt is a 63 y.o. female with a hx of DM2, asthma, HTN, obesity, and HFmrEF/nonischemic cardiomyopathy.   Admitted 8/25 with hypertensive urgency and found to have new systolic heart failure. BP in ED 192/125. Echo showed EF 40-45%, diffuse hypokinesis, RV moderately reduced systolic function. Underwent R/LHC showing normal coronaries, well-compensated hemodynamics after diuresis. 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 GDMT titrated and she was discharged home, weight 205 lbs.  Echo was done today and reviewed, EF 45%, mild RV dysfunction, normal IVC.   Patient has been out of spironolactone  and has not taken Lasix  since last Sunday. She had already cut her Lasix  back to 40 mg once daily.  She is short of breath walking long distances.  No chest pain.  No orthopnea/PND.  No lightheadedness or palpitations.  She snores and is sleepy at times during the day.  BP is elevated today.   ECG (personally reviewed): NSR, lateral TWIs  Labs (8/25): K 4.0, creatinine 1.0 => 1.4, BNP 89, hgb 14.8  PMH: 1. CKD stage 3: Likely related to HTN.  2. HTN 3. Type 2 diabetes 4. Asthma 5. Chronic HF with mid range EF:  Nonischemic cardiomyopathy.  - R/LHC (8/25): normal coronaries; RA 5, PA 39/15 (25), PCWP 8, CO/CI (Fick) 4.76/2.46, PAPi 4.8 - Echo (8/25): EF 40-45%, mild LVH, RV moderately reduced. - Echo (11/25): EF 45%, mild LVH, mild RV dysfunction, normal IVC. 6. Bilateral carpal tunnel syndrome.   FH: HTN, no CHF.  SH: Retired from post office, 1 daughter, nonsmoker, no ETOH.   ROS: All systems reviewed and negative except as per HPI.    Current Outpatient Medications  Medication Sig Dispense Refill   albuterol  (PROVENTIL  HFA;VENTOLIN  HFA) 108 (90 BASE) MCG/ACT inhaler Inhale 2 puffs into the lungs every 4 (four) hours as  needed for wheezing or shortness of breath. For shortness of breath.     Blood Glucose Monitoring Suppl DEVI 1 each by Does not apply route in the morning, at noon, and at bedtime. May substitute to any manufacturer covered by patient's insurance. 1 each 0   carvedilol  (COREG ) 3.125 MG tablet Take 1 tablet (3.125 mg total) by mouth 2 (two) times daily with a meal. 60 tablet 6   Dulaglutide 1.5 MG/0.5ML SOAJ Inject 1.5 mg into the skin once a week. Inject 1.5mg  subcutaneously weekly on Sunday.     ENTRESTO  49-51 MG Take 1 tablet by mouth 2 (two) times daily. 60 tablet 11   FARXIGA  10 MG TABS tablet Take 1 tablet (10 mg total) by mouth daily before breakfast. 90 tablet 3   glipiZIDE  (GLUCOTROL  XL) 10 MG 24 hr tablet Take 10 mg by mouth.     loratadine  (CLARITIN ) 10 MG tablet Take 10 mg by mouth daily.     spironolactone  (ALDACTONE ) 25 MG tablet Take 0.5 tablets (12.5 mg total) by mouth daily. 45 tablet 3   traMADol (ULTRAM) 50 MG tablet Take 50 mg by mouth daily as needed for moderate pain (pain score 4-6).     No current facility-administered medications for this visit.   Allergies  Allergen Reactions   Penicillins Hives    Wt Readings from Last 3 Encounters:  09/14/24 98.7 kg (217 lb 9.6 oz)  06/30/24 92.1 kg (203 lb)  06/24/24 92.4 kg (203 lb 11.2 oz)   LMP  12/26/2012   PHYSICAL EXAM: General: NAD Neck: No JVD, no thyromegaly or thyroid nodule.  Lungs: Clear to auscultation bilaterally with normal respiratory effort. CV: Nondisplaced PMI.  Heart regular S1/S2, no S3/S4, no murmur.  No peripheral edema.  No carotid bruit.  Normal pedal pulses.  Abdomen: Soft, nontender, no hepatosplenomegaly, no distention.  Skin: Intact without lesions or rashes.  Neurologic: Alert and oriented x 3.  Psych: Normal affect. Extremities: No clubbing or cyanosis.  HEENT: Normal.   ASSESSMENT & PLAN: 1. HFmrEF: Nonischemic cardiomyopathy. Echo 8/25 showed EF 40-45%, moderate RV dysfunction. R/LHC  (8/25) showed no CAD, well-compensated filling pressures. Long history of HTN, possible hypertensive cardiomyopathy.  Echo today showed EF 45%, mild LVH, mild RV dysfunction, normal IVC.  With LVH and carpal tunnel syndrome, consider amyloidosis. She is not volume overloaded on exam today.  NYHA class II symptoms.  - I think she can stop Lasix .   - Increase Entresto  to 49/51 mg bid.  - Continue Coreg  3.125 mg bid. - Continue spironolactone  12.5 mg daily. - Start Farxiga  10 mg daily.  BMET/BNP today, BMET in 10 days.  - Restart spironolactone  12.5 daily. - I will arrange for cardiac MRI to assess for amyloidosis or other infiltrative disease.  2. HTN: Long-standing history. BP elevated today. - Increasing Entresto  as above.  - Restarting spironolactone .  - Will need sleep study.  - Renal artery dopplers to screen for renal artery stenosis.  3. Obesity: There is no height or weight on file to calculate BMI. She has lost 100 lbs. She is on Trulicity. 4. Snoring: Suspect sleep apnea. - Arrange sleep study.  Followup in 3 wks with HF pharmacist for med tiration, see APP in 6 wks.   I spent 57 minutes reviewing records, interviewing/examining patient, and managing orders.   Ann Holt 10/18/24

## 2024-10-19 ENCOUNTER — Ambulatory Visit (HOSPITAL_COMMUNITY)

## 2024-10-28 ENCOUNTER — Telehealth (HOSPITAL_COMMUNITY): Payer: Self-pay

## 2024-10-28 NOTE — Telephone Encounter (Signed)
 Called to confirm/remind patient of their appointment at the Advanced Heart Failure Clinic on 10/31/24.However, pt canceled and will keep her January appointment.

## 2024-10-28 NOTE — Telephone Encounter (Signed)
 error

## 2024-10-31 ENCOUNTER — Ambulatory Visit (HOSPITAL_COMMUNITY)

## 2024-11-15 ENCOUNTER — Telehealth (HOSPITAL_COMMUNITY): Payer: Self-pay

## 2024-11-15 NOTE — Telephone Encounter (Signed)
 Called to confirm/remind patient of their appointment at the Advanced Heart Failure Clinic on 11/16/24.   Appointment:   [] Confirmed  [x] Left mess   [] No answer/No voice mail  [] VM Full/unable to leave message  [] Phone not in service  And to bring in all medications and/or complete list.

## 2024-11-15 NOTE — Progress Notes (Incomplete)
 "   PCP: Ann Lenis, MD  HF Cardiology: Dr Rolan  HPI: Referred to HF MD clinic from [facility] clinic for evaluation of CHF.   Ann Holt is a 64 y.o. female with a hx of DM2, asthma, HTN, obesity, and HFmrEF/nonischemic cardiomyopathy.   Admitted 8/25 with hypertensive urgency and found to have new systolic heart failure. BP in ED 192/125. Echo showed EF 40-45%, diffuse hypokinesis, RV moderately reduced systolic function. Underwent R/LHC showing normal coronaries, well-compensated hemodynamics after diuresis. 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 GDMT titrated and she was discharged home, weight 205 lbs.  Echo 11/25 showed EF 45%, mild RV dysfunction, normal IVC.   Today she returns for HF follow up with her daughter. Overall feeling fine. Recovering from URI, but no SOB with ADLs. Denies palpitations, abnormal bleeding, CP, dizziness, edema, or PND/Orthopnea. Appetite ok. Weight at home 215-220 pounds. Taking all medications.   ECG: not performed   Labs (8/25): K 4.0, creatinine 1.0 => 1.4, BNP 89, hgb 14.8 Labs (11/25): K 4.9, creatinine 1.03   PMH: 1. CKD stage 3: Likely related to HTN.  2. HTN 3. Type 2 diabetes 4. Asthma 5. Chronic HF with mid range EF:  Nonischemic cardiomyopathy.  - R/LHC (8/25): normal coronaries; RA 5, PA 39/15 (25), PCWP 8, CO/CI (Fick) 4.76/2.46, PAPi 4.8 - Echo (8/25): EF 40-45%, mild LVH, RV moderately reduced. - Echo (11/25): EF 45%, mild LVH, mild RV dysfunction, normal IVC. 6. Bilateral carpal tunnel syndrome.   FH: HTN, no CHF.  SH: Retired from post office, 1 daughter, nonsmoker, no ETOH.   ROS: All systems reviewed and negative except as per HPI.   Current Outpatient Medications  Medication Sig Dispense Refill   Blood Glucose Monitoring Suppl DEVI 1 each by Does not apply route in the morning, at noon, and at bedtime. May substitute to any manufacturer covered by patient's insurance. 1 each 0   Dulaglutide 1.5 MG/0.5ML SOAJ  Inject 1.5 mg into the skin once a week. Inject 1.5mg  subcutaneously weekly on Sunday.     FARXIGA  10 MG TABS tablet Take 1 tablet (10 mg total) by mouth daily before breakfast. 90 tablet 3   glipiZIDE  (GLUCOTROL  XL) 10 MG 24 hr tablet Take 10 mg by mouth.     loratadine  (CLARITIN ) 10 MG tablet Take 10 mg by mouth daily.     sacubitril -valsartan  (ENTRESTO ) 97-103 MG Take 1 tablet by mouth 2 (two) times daily. 180 tablet 3   spironolactone  (ALDACTONE ) 25 MG tablet Take 0.5 tablets (12.5 mg total) by mouth daily. 45 tablet 3   traMADol (ULTRAM) 50 MG tablet Take 50 mg by mouth daily as needed for moderate pain (pain score 4-6).     albuterol  (VENTOLIN  HFA) 108 (90 Base) MCG/ACT inhaler Inhale 2 puffs into the lungs every 4 (four) hours as needed for wheezing or shortness of breath. For shortness of breath. 1 each 0   carvedilol  (COREG ) 6.25 MG tablet Take 1 tablet (6.25 mg total) by mouth 2 (two) times daily with a meal. 60 tablet 6   No current facility-administered medications for this encounter.   Allergies  Allergen Reactions   Penicillins Hives   Wt Readings from Last 3 Encounters:  11/16/24 100.4 kg (221 lb 6.4 oz)  09/14/24 98.7 kg (217 lb 9.6 oz)  06/30/24 92.1 kg (203 lb)   BP 130/80   Pulse 100   Ht 5' 1 (1.549 m)   Wt 100.4 kg (221 lb 6.4 oz)   LMP 12/26/2012  SpO2 96%   BMI 41.83 kg/m   Physical Exam  General:  NAD. No resp difficulty, arrived in [facility], chronically-ill appearing HEENT: Normal Neck: Supple. No JVD. Thick neck Cor: Tachy regular rate & rhythm. No rubs, gallops or murmurs. Lungs: Clear Abdomen: Soft, obese, nontender, nondistended.  Extremities: No cyanosis, clubbing, rash, edema Neuro: Alert & oriented x 3, moves all 4 extremities w/o difficulty. Affect pleasant.  ASSESSMENT & PLAN: 1. HFmrEF: Nonischemic cardiomyopathy. Echo 8/25 showed EF 40-45%, moderate RV dysfunction. R/LHC (8/25) showed no CAD, well-compensated filling pressures. Long history of  HTN, possible hypertensive cardiomyopathy.  Echo 11/25 showed EF 45%, mild LVH, mild RV dysfunction, normal IVC.  With LVH and carpal tunnel syndrome, consider amyloidosis. She is not volume overloaded on exam today.  NYHA class II symptoms.  - Increase Entresto  to 97/103 mg bid. Recent labs reviewed and are stable, BMET in 10-14 days. - Increase Coreg  to 6.25 mg bid. - Continue spironolactone  12.5 mg daily. - Continue Farxiga  10 mg daily.    - Continue Lasix  PRN  - Arrange cardiac MRI to assess for amyloidosis or other infiltrative disease has been arranged.  2. HTN: Long-standing history. BP elevated today. - Med changes as above - Will need sleep study.  - Check renal artery dopplers to screen for renal artery stenosis.  3. Obesity: Body mass index is 41.83 kg/m. She has lost 100 lbs. She is on Trulicity. 4. Snoring: Suspect sleep apnea. - Arrange sleep study.  Followup in 3 months with APP.  Ann Holt [facility] FNP-BC 11/16/2024  "

## 2024-11-16 ENCOUNTER — Ambulatory Visit (HOSPITAL_COMMUNITY)
Admission: RE | Admit: 2024-11-16 | Discharge: 2024-11-16 | Disposition: A | Discharge status: Home / Self Care | Source: Ambulatory Visit | Attending: Family Medicine | Admitting: Family Medicine

## 2024-11-16 ENCOUNTER — Encounter (HOSPITAL_COMMUNITY): Payer: Self-pay

## 2024-11-16 VITALS — BP 130/80 | HR 100 | Ht 61.0 in | Wt 221.4 lb

## 2024-11-16 DIAGNOSIS — I130 Hypertensive heart and chronic kidney disease with heart failure and stage 1 through stage 4 chronic kidney disease, or unspecified chronic kidney disease: Secondary | ICD-10-CM | POA: Diagnosis not present

## 2024-11-16 DIAGNOSIS — I428 Other cardiomyopathies: Secondary | ICD-10-CM | POA: Diagnosis not present

## 2024-11-16 DIAGNOSIS — Z7985 Long-term (current) use of injectable non-insulin antidiabetic drugs: Secondary | ICD-10-CM | POA: Diagnosis not present

## 2024-11-16 DIAGNOSIS — R0683 Snoring: Secondary | ICD-10-CM | POA: Insufficient documentation

## 2024-11-16 DIAGNOSIS — E1122 Type 2 diabetes mellitus with diabetic chronic kidney disease: Secondary | ICD-10-CM | POA: Insufficient documentation

## 2024-11-16 DIAGNOSIS — I10 Essential (primary) hypertension: Secondary | ICD-10-CM

## 2024-11-16 DIAGNOSIS — Z79899 Other long term (current) drug therapy: Secondary | ICD-10-CM | POA: Insufficient documentation

## 2024-11-16 DIAGNOSIS — N183 Chronic kidney disease, stage 3 unspecified: Secondary | ICD-10-CM | POA: Diagnosis not present

## 2024-11-16 DIAGNOSIS — I5022 Chronic systolic (congestive) heart failure: Secondary | ICD-10-CM | POA: Diagnosis present

## 2024-11-16 DIAGNOSIS — G4733 Obstructive sleep apnea (adult) (pediatric): Secondary | ICD-10-CM | POA: Insufficient documentation

## 2024-11-16 DIAGNOSIS — J45909 Unspecified asthma, uncomplicated: Secondary | ICD-10-CM | POA: Diagnosis not present

## 2024-11-16 DIAGNOSIS — Z7984 Long term (current) use of oral hypoglycemic drugs: Secondary | ICD-10-CM | POA: Diagnosis not present

## 2024-11-16 DIAGNOSIS — E669 Obesity, unspecified: Secondary | ICD-10-CM | POA: Diagnosis not present

## 2024-11-16 DIAGNOSIS — E1141 Type 2 diabetes mellitus with diabetic mononeuropathy: Secondary | ICD-10-CM | POA: Diagnosis not present

## 2024-11-16 DIAGNOSIS — G5600 Carpal tunnel syndrome, unspecified upper limb: Secondary | ICD-10-CM | POA: Insufficient documentation

## 2024-11-16 DIAGNOSIS — Z6841 Body Mass Index (BMI) 40.0 and over, adult: Secondary | ICD-10-CM | POA: Insufficient documentation

## 2024-11-16 MED ORDER — ALBUTEROL SULFATE HFA 108 (90 BASE) MCG/ACT IN AERS: 2.0000 | INHALATION_SPRAY | RESPIRATORY_TRACT | 0 refills | Status: AC | PRN

## 2024-11-16 MED ORDER — CARVEDILOL 6.25 MG PO TABS: 6.2500 mg | ORAL_TABLET | Freq: Two times a day (BID) | ORAL | 6 refills | Status: AC

## 2024-11-16 MED ORDER — SACUBITRIL-VALSARTAN 97-103 MG PO TABS: 1.0000 | ORAL_TABLET | Freq: Two times a day (BID) | ORAL | 3 refills | Status: AC

## 2024-11-16 NOTE — Patient Instructions (Addendum)
 Good to see you today!    INCREASE coreg  to 6.25 mg ( 1 tablet) Twice daily  INCREASE Entresto   to 97/103 mg ( 1 tablet)Twice daily  Cardiac MRI ordered they will call to schedule test   Sleep study ordered they will call to schedule  Your physician recommends that you schedule a follow-up appointment as scheduled  If you have any questions or concerns before your next appointment please send us  a message through [place] or call our office at [phone].    TO LEAVE A MESSAGE FOR THE NURSE SELECT OPTION 2, PLEASE LEAVE A MESSAGE INCLUDING: YOUR NAME DATE OF BIRTH CALL BACK NUMBER REASON FOR CALL**this is important as we prioritize the call backs  YOU WILL RECEIVE A CALL BACK THE SAME DAY AS LONG AS YOU CALL BEFORE 4:00 PM At the [facility], you and your health needs are our priority. As part of our continuing mission to provide you with exceptional heart care, we have created designated Provider Care Teams. These Care Teams include your primary Cardiologist (physician) and Advanced Practice Providers (APPs- Physician Assistants and Nurse Practitioners) who all work together to provide you with the care you need, when you need it.   You may see any of the following providers on your designated Care Team at your next follow up: Dr Toribio Fuel Dr Ezra Shuck Dr. Morene Brownie Greig Mosses, NP Caffie Shed, [place] [facility], [place] Beckey Coe, NP Jordan Lee, NP Ellouise Class, NP Tinnie Redman, PharmD Jaun Bash, PharmD   Please be sure to bring in all your medications bottles to every appointment.    Thank you for choosing [facility]

## 2024-11-16 NOTE — Addendum Note (Signed)
 Encounter addended by: Glena [name], FNP on: 11/16/2024 4:17 PM  Actions taken: Vitals modified, Clinical Note Signed

## 2024-11-16 NOTE — Addendum Note (Signed)
 Encounter addended by: Glendale Youngblood M, RN on: 11/16/2024 4:19 PM  Actions taken: Flowsheet accepted

## 2024-11-24 ENCOUNTER — Ambulatory Visit (HOSPITAL_COMMUNITY)
Admission: RE | Admit: 2024-11-24 | Discharge: 2024-11-24 | Disposition: A | Discharge status: Home / Self Care | Source: Ambulatory Visit | Attending: Cardiology | Admitting: Cardiology

## 2024-11-24 DIAGNOSIS — I5022 Chronic systolic (congestive) heart failure: Secondary | ICD-10-CM | POA: Insufficient documentation

## 2024-11-24 DIAGNOSIS — I10 Essential (primary) hypertension: Secondary | ICD-10-CM | POA: Diagnosis present

## 2024-11-28 ENCOUNTER — Telehealth (HOSPITAL_COMMUNITY): Payer: Self-pay | Admitting: *Deleted

## 2024-11-28 NOTE — Telephone Encounter (Signed)
 Called patient per Dr. Rolan with following results:   No evidence for renal artery stenosis.   Pt verbalized understanding of same. No further questions at this time.

## 2024-11-30 ENCOUNTER — Ambulatory Visit (HOSPITAL_COMMUNITY)
Admission: RE | Admit: 2024-11-30 | Discharge: 2024-11-30 | Disposition: A | Discharge status: Home / Self Care | Source: Ambulatory Visit | Attending: Cardiology | Admitting: Cardiology

## 2024-11-30 DIAGNOSIS — I5022 Chronic systolic (congestive) heart failure: Secondary | ICD-10-CM

## 2024-11-30 NOTE — Addendum Note (Signed)
 Encounter addended by: Buell Powell HERO, RN on: 11/30/2024 4:49 PM  Actions taken: Clinical Note Signed, Order list changed, Diagnosis association updated

## 2024-11-30 NOTE — Progress Notes (Signed)
 Received call from lab, blood was hemolyzed and they are not able to process.  Called pt, she is aware but unable to come back this week, she will call us  back next week to reschedule.

## 2025-02-15 ENCOUNTER — Ambulatory Visit (HOSPITAL_COMMUNITY)
